# Patient Record
Sex: Male | Born: 1960
Health system: Southern US, Community
[De-identification: ages and names within clinical notes are randomized; demographics above are authoritative.]

## PROBLEM LIST (undated history)

## (undated) DIAGNOSIS — I10 Essential (primary) hypertension: Secondary | ICD-10-CM

## (undated) DIAGNOSIS — E785 Hyperlipidemia, unspecified: Secondary | ICD-10-CM

## (undated) DIAGNOSIS — R739 Hyperglycemia, unspecified: Secondary | ICD-10-CM

## (undated) DIAGNOSIS — E119 Type 2 diabetes mellitus without complications: Secondary | ICD-10-CM

## (undated) DIAGNOSIS — Z8719 Personal history of other diseases of the digestive system: Secondary | ICD-10-CM

## (undated) HISTORY — PX: TONSILLECTOMY: SUR1361

## (undated) HISTORY — PX: WISDOM TOOTH EXTRACTION: SHX21

## (undated) HISTORY — DX: Type 2 diabetes mellitus without complications: E11.9

## (undated) HISTORY — DX: Personal history of other diseases of the digestive system: Z87.19

## (undated) HISTORY — PX: ADENOIDECTOMY: SUR15

## (undated) HISTORY — DX: Hyperlipidemia, unspecified: E78.5

## (undated) HISTORY — PX: OTHER SURGICAL HISTORY: SHX169

## (undated) HISTORY — DX: Hyperglycemia, unspecified: R73.9

## (undated) HISTORY — DX: Essential (primary) hypertension: I10

---

## 1998-06-29 ENCOUNTER — Emergency Department (HOSPITAL_COMMUNITY): Admission: EM | Admit: 1998-06-29 | Discharge: 1998-06-29 | Payer: Self-pay | Admitting: Emergency Medicine

## 1999-10-16 ENCOUNTER — Encounter: Payer: Self-pay | Admitting: Internal Medicine

## 2003-12-31 HISTORY — PX: COLONOSCOPY: SHX174

## 2004-06-27 ENCOUNTER — Encounter: Payer: Self-pay | Admitting: Internal Medicine

## 2004-07-30 DIAGNOSIS — Z8719 Personal history of other diseases of the digestive system: Secondary | ICD-10-CM

## 2004-07-30 HISTORY — DX: Personal history of other diseases of the digestive system: Z87.19

## 2004-08-15 ENCOUNTER — Ambulatory Visit (HOSPITAL_COMMUNITY): Admission: RE | Admit: 2004-08-15 | Discharge: 2004-08-15 | Payer: Self-pay | Admitting: Gastroenterology

## 2004-08-15 ENCOUNTER — Encounter: Payer: Self-pay | Admitting: Internal Medicine

## 2004-08-15 ENCOUNTER — Encounter (INDEPENDENT_AMBULATORY_CARE_PROVIDER_SITE_OTHER): Payer: Self-pay | Admitting: *Deleted

## 2004-11-29 ENCOUNTER — Ambulatory Visit: Payer: Self-pay | Admitting: Internal Medicine

## 2004-12-05 ENCOUNTER — Ambulatory Visit: Payer: Self-pay | Admitting: Internal Medicine

## 2005-02-06 ENCOUNTER — Ambulatory Visit: Payer: Self-pay | Admitting: Internal Medicine

## 2005-02-13 ENCOUNTER — Ambulatory Visit: Payer: Self-pay | Admitting: Internal Medicine

## 2005-04-15 ENCOUNTER — Ambulatory Visit: Payer: Self-pay | Admitting: Internal Medicine

## 2005-04-22 ENCOUNTER — Ambulatory Visit: Payer: Self-pay | Admitting: Internal Medicine

## 2005-04-30 ENCOUNTER — Encounter: Admission: RE | Admit: 2005-04-30 | Discharge: 2005-07-29 | Payer: Self-pay | Admitting: Internal Medicine

## 2005-06-24 ENCOUNTER — Ambulatory Visit: Payer: Self-pay | Admitting: Internal Medicine

## 2005-08-13 ENCOUNTER — Ambulatory Visit: Payer: Self-pay | Admitting: Internal Medicine

## 2005-08-22 ENCOUNTER — Ambulatory Visit: Payer: Self-pay | Admitting: Internal Medicine

## 2006-01-28 ENCOUNTER — Ambulatory Visit: Payer: Self-pay | Admitting: Internal Medicine

## 2006-02-03 ENCOUNTER — Ambulatory Visit: Payer: Self-pay | Admitting: Internal Medicine

## 2006-04-01 ENCOUNTER — Ambulatory Visit: Payer: Self-pay | Admitting: Internal Medicine

## 2006-04-08 ENCOUNTER — Ambulatory Visit: Payer: Self-pay | Admitting: Internal Medicine

## 2006-06-23 ENCOUNTER — Ambulatory Visit: Payer: Self-pay | Admitting: Internal Medicine

## 2006-08-21 ENCOUNTER — Ambulatory Visit: Payer: Self-pay | Admitting: Internal Medicine

## 2006-08-28 ENCOUNTER — Ambulatory Visit: Payer: Self-pay | Admitting: Internal Medicine

## 2006-12-03 ENCOUNTER — Ambulatory Visit: Payer: Self-pay | Admitting: Internal Medicine

## 2006-12-03 LAB — CONVERTED CEMR LAB
ALT: 32 units/L (ref 0–40)
AST: 34 units/L (ref 0–37)
BUN: 14 mg/dL (ref 6–23)
Chloride: 107 meq/L (ref 96–112)
Creatinine, Ser: 1 mg/dL (ref 0.4–1.5)
GFR calc non Af Amer: 87 mL/min
LDL Cholesterol: 81 mg/dL (ref 0–99)
Potassium: 4.3 meq/L (ref 3.5–5.1)
Sodium: 141 meq/L (ref 135–145)
Triglyceride fasting, serum: 71 mg/dL (ref 0–149)
VLDL: 14 mg/dL (ref 0–40)

## 2006-12-09 ENCOUNTER — Ambulatory Visit: Payer: Self-pay | Admitting: Internal Medicine

## 2007-06-04 ENCOUNTER — Ambulatory Visit: Payer: Self-pay | Admitting: Internal Medicine

## 2007-06-10 ENCOUNTER — Encounter: Payer: Self-pay | Admitting: Internal Medicine

## 2007-06-10 ENCOUNTER — Ambulatory Visit: Payer: Self-pay | Admitting: Internal Medicine

## 2007-06-10 DIAGNOSIS — R7301 Impaired fasting glucose: Secondary | ICD-10-CM | POA: Insufficient documentation

## 2007-06-10 DIAGNOSIS — Z8719 Personal history of other diseases of the digestive system: Secondary | ICD-10-CM | POA: Insufficient documentation

## 2007-06-10 DIAGNOSIS — E785 Hyperlipidemia, unspecified: Secondary | ICD-10-CM | POA: Insufficient documentation

## 2007-06-10 DIAGNOSIS — I10 Essential (primary) hypertension: Secondary | ICD-10-CM | POA: Insufficient documentation

## 2007-10-13 ENCOUNTER — Ambulatory Visit: Payer: Self-pay | Admitting: Internal Medicine

## 2007-10-16 LAB — CONVERTED CEMR LAB
ALT: 17 units/L (ref 0–53)
AST: 19 units/L (ref 0–37)
Calcium: 9.5 mg/dL (ref 8.4–10.5)
Chloride: 108 meq/L (ref 96–112)
Creatinine, Ser: 1 mg/dL (ref 0.4–1.5)
GFR calc non Af Amer: 87 mL/min
HDL: 43.4 mg/dL (ref >39.0)
LDL Cholesterol: 114 mg/dL — ABNORMAL HIGH (ref 0–99)
Sodium: 144 meq/L (ref 135–145)
TSH: 1.84 microintl units/mL (ref 0.35–5.50)
VLDL: 14 mg/dL (ref 0–40)

## 2007-10-20 ENCOUNTER — Ambulatory Visit: Payer: Self-pay | Admitting: Internal Medicine

## 2007-10-20 LAB — CONVERTED CEMR LAB
Cholesterol, target level: 200 mg/dL
LDL Goal: 160 mg/dL

## 2008-02-01 ENCOUNTER — Ambulatory Visit: Payer: Self-pay | Admitting: Internal Medicine

## 2008-02-03 LAB — CONVERTED CEMR LAB
BUN: 11 mg/dL (ref 6–23)
Calcium: 9.4 mg/dL (ref 8.4–10.5)
Chloride: 107 meq/L (ref 96–112)
Creatinine, Ser: 1.1 mg/dL (ref 0.4–1.5)
GFR calc non Af Amer: 77 mL/min
Hgb A1c MFr Bld: 5.7 % (ref 4.6–6.0)
LDL Cholesterol: 115 mg/dL — ABNORMAL HIGH (ref 0–99)
Sodium: 141 meq/L (ref 135–145)
VLDL: 14 mg/dL (ref 0–40)

## 2008-02-08 ENCOUNTER — Ambulatory Visit: Payer: Self-pay | Admitting: Internal Medicine

## 2008-07-05 ENCOUNTER — Ambulatory Visit: Payer: Self-pay | Admitting: Internal Medicine

## 2008-07-05 LAB — CONVERTED CEMR LAB
Creatinine,U: 151.4 mg/dL
Hgb A1c MFr Bld: 6.1 % — ABNORMAL HIGH (ref 4.6–6.0)
Microalb Creat Ratio: 1.3 mg/g (ref 0.0–30.0)
Microalb, Ur: 0.2 mg/dL (ref 0.0–1.9)

## 2008-07-14 ENCOUNTER — Ambulatory Visit: Payer: Self-pay | Admitting: Internal Medicine

## 2009-01-02 ENCOUNTER — Ambulatory Visit: Payer: Self-pay | Admitting: Internal Medicine

## 2009-01-02 LAB — CONVERTED CEMR LAB
BUN: 11 mg/dL (ref 6–23)
Creatinine, Ser: 1 mg/dL (ref 0.4–1.5)
GFR calc Af Amer: 104 mL/min
Glucose, Bld: 178 mg/dL — ABNORMAL HIGH (ref 70–99)
Potassium: 4.1 meq/L (ref 3.5–5.1)

## 2009-01-09 ENCOUNTER — Ambulatory Visit: Payer: Self-pay | Admitting: Internal Medicine

## 2009-02-17 ENCOUNTER — Telehealth: Payer: Self-pay | Admitting: *Deleted

## 2009-05-02 ENCOUNTER — Telehealth: Payer: Self-pay | Admitting: *Deleted

## 2009-05-08 ENCOUNTER — Ambulatory Visit: Payer: Self-pay | Admitting: Internal Medicine

## 2009-05-08 LAB — CONVERTED CEMR LAB
ALT: 18 units/L (ref 0–53)
AST: 22 units/L (ref 0–37)
Alkaline Phosphatase: 51 units/L (ref 39–117)
BUN: 18 mg/dL (ref 6–23)
Bilirubin Urine: NEGATIVE
Bilirubin, Direct: 0.2 mg/dL (ref 0.0–0.3)
Cholesterol: 165 mg/dL (ref 0–200)
Creatinine, Ser: 0.9 mg/dL (ref 0.4–1.5)
Eosinophils Relative: 2 % (ref 0.0–5.0)
GFR calc non Af Amer: 96.75 mL/min (ref >60)
Hgb A1c MFr Bld: 6.2 % (ref 4.6–6.5)
Ketones, urine, test strip: NEGATIVE
LDL Cholesterol: 108 mg/dL — ABNORMAL HIGH (ref 0–99)
Lymphocytes Relative: 28.5 % (ref 12.0–46.0)
Microalb Creat Ratio: 2.6 mg/g (ref 0.0–30.0)
Monocytes Relative: 6.1 % (ref 3.0–12.0)
Neutrophils Relative %: 63.1 % (ref 43.0–77.0)
Nitrite: NEGATIVE
Platelets: 157 10*3/uL (ref 150.0–400.0)
Protein, U semiquant: NEGATIVE
Total Bilirubin: 1.1 mg/dL (ref 0.3–1.2)
Total CHOL/HDL Ratio: 4
Triglycerides: 66 mg/dL (ref 0.0–149.0)
Urobilinogen, UA: 0.2
VLDL: 13.2 mg/dL (ref 0.0–40.0)
WBC: 4.2 10*3/uL — ABNORMAL LOW (ref 4.5–10.5)

## 2009-05-15 ENCOUNTER — Ambulatory Visit: Payer: Self-pay | Admitting: Internal Medicine

## 2009-05-15 DIAGNOSIS — T50995A Adverse effect of other drugs, medicaments and biological substances, initial encounter: Secondary | ICD-10-CM | POA: Insufficient documentation

## 2009-05-15 DIAGNOSIS — B351 Tinea unguium: Secondary | ICD-10-CM | POA: Insufficient documentation

## 2009-06-19 ENCOUNTER — Ambulatory Visit: Payer: Self-pay | Admitting: Internal Medicine

## 2009-06-19 DIAGNOSIS — M25469 Effusion, unspecified knee: Secondary | ICD-10-CM | POA: Insufficient documentation

## 2009-06-24 ENCOUNTER — Encounter: Payer: Self-pay | Admitting: Internal Medicine

## 2009-10-10 ENCOUNTER — Ambulatory Visit: Payer: Self-pay | Admitting: Internal Medicine

## 2009-10-10 LAB — CONVERTED CEMR LAB: Hgb A1c MFr Bld: 5.8 % (ref 4.6–6.5)

## 2009-10-17 ENCOUNTER — Ambulatory Visit: Payer: Self-pay | Admitting: Internal Medicine

## 2010-05-11 ENCOUNTER — Ambulatory Visit: Payer: Self-pay | Admitting: Internal Medicine

## 2010-05-11 LAB — CONVERTED CEMR LAB
ALT: 21 units/L (ref 0–53)
Albumin: 4.5 g/dL (ref 3.5–5.2)
Alkaline Phosphatase: 46 units/L (ref 39–117)
Basophils Relative: 0.4 % (ref 0.0–3.0)
Bilirubin Urine: NEGATIVE
CO2: 30 meq/L (ref 19–32)
Chloride: 106 meq/L (ref 96–112)
Eosinophils Absolute: 0.1 10*3/uL (ref 0.0–0.7)
Hemoglobin: 14.5 g/dL (ref 13.0–17.0)
Hgb A1c MFr Bld: 5.8 % (ref 4.6–6.5)
Ketones, urine, test strip: NEGATIVE
MCHC: 34.5 g/dL (ref 30.0–36.0)
MCV: 90.5 fL (ref 78.0–100.0)
Monocytes Absolute: 0.3 10*3/uL (ref 0.1–1.0)
Neutro Abs: 3.1 10*3/uL (ref 1.4–7.7)
Nitrite: NEGATIVE
PSA: 1.08 ng/mL (ref 0.10–4.00)
Potassium: 4.6 meq/L (ref 3.5–5.1)
RBC: 4.66 M/uL (ref 4.22–5.81)
Sodium: 145 meq/L (ref 135–145)
Specific Gravity, Urine: 1.025
Total CHOL/HDL Ratio: 3
Total Protein: 7.3 g/dL (ref 6.0–8.3)

## 2010-05-18 ENCOUNTER — Ambulatory Visit: Payer: Self-pay | Admitting: Internal Medicine

## 2010-05-30 ENCOUNTER — Telehealth: Payer: Self-pay | Admitting: *Deleted

## 2011-01-29 NOTE — Procedures (Signed)
Summary: Colonoscopy/[place]  Colonoscopy/[place]   Imported By: Sherian Rein 06/15/2010 14:22:52  _____________________________________________________________________  External Attachment:    Type:   Image     Comment:   External Document

## 2011-01-29 NOTE — Progress Notes (Signed)
Summary: refill  Phone Note From Pharmacy   Caller: [facility] Reason for Call: Needs renewal Details for Reason: simvastatin Initial call taken by: Romualdo Bolk, CMA Duncan Dull),  May 30, 2010 1:33 PM  Follow-up for Phone Call        Rx sent to pharmacy Follow-up by: Romualdo Bolk, CMA (AAMA),  May 30, 2010 1:34 PM    Prescriptions: ZOCOR 80 MG  TABS (SIMVASTATIN) 1 by mouth once daily  #90 x 3   Entered by:   Romualdo Bolk, CMA (AAMA)   Authorized by:   Madelin Headings MD   Signed by:   Romualdo Bolk, CMA (AAMA) on 05/30/2010   Method used:   Electronically to        [facility]* (mail-order)             ,          Ph: [phone]       Fax: [phone]   RxID:   [id number]

## 2011-01-29 NOTE — Assessment & Plan Note (Signed)
Summary: CPX//SL[facility]   Vital Signs:  Patient profile:   50 year old male Height:      70.75 inches Weight:      203 pounds BMI:     28.62 Pulse rate:   60 / minute BP sitting:   130 / 80  (left arm) Cuff size:   regular CC: CPX   History of Present Illness: David Copeland comesin for preventive visit  and no chang ein health since last visit. Is doing well  going to train for a marathin in the fall.     No injuries  new meds or Ed visits. BP on no meds and doing well LIPIDs no se of meds. BG taking betformin   no se and doing well.   No numbness change in vision, CV or PUlm problems .  Preventive Care Screening  Prior Values:    PSA:  1.08 (05/11/2010)    Last Tetanus Booster:  Tdap (05/15/2009)   Preventive Screening-Counseling & Management  Alcohol-Tobacco     Alcohol drinks/day: <1     Alcohol type: beer     Smoking Status: never  Caffeine-Diet-Exercise     Caffeine use/day: 5     Does Patient Exercise: yes     Depression Counseling: not indicated; screening negative for depression  Hep-HIV-STD-Contraception     Dental Visit-last 6 months yes     Sun Exposure-Excessive: no  Safety-Violence-Falls     Seat Belt Use: yes     Firearms in the Home: no firearms in the home     Smoke Detectors: yes      Blood Transfusions:  no.    Current Medications (verified): 1)  Tgt Aspirin 81 Mg Tbec (Aspirin) .... Take 1 Tablet By Mouth Once A Day 2)  Zocor 80 Mg  Tabs (Simvastatin) .Marland Kitchen.. 1 By Mouth Once Daily 3)  Metformin Hcl 500 Mg Tabs (Metformin Hcl) .Marland Kitchen.. 1 By Mouth 1-2 X Per Day  Allergies (verified): 1)  Lipitor  Past History:  Past medical, surgical, family and social histories (including risk factors) reviewed, and no changes noted (except as noted below).  Past Medical History: Diverticulitis, hx of colonoscopy  8.05 Hyperlipidemia Hypertension  off med after lifestyle intervention  controlled  hyperglycemia   Consults: None    Past Surgical  History: Reviewed history from 01/09/2009 and no changes required. Removed Breast Glands- Damaged Secondary to Soccer Wisdom Tooth Extraction Adenoidectomy Tonsillectomy   Past History:  Care Management: Gastroenterology: Dr Loreta Ave   Family History: Reviewed history from 06/19/2009 and no changes required. Family History of Stroke M 1st degree relative 15  Father in hot weather ? neg hx of arhtirtis no known premature heart disease.  Social History: Reviewed history from 01/09/2009 and no changes required. Married with children Former Smoker Regular exercise-yes  is [place] as homeland      No ets  . runs    Sleep adequate.Seat Belt Use:  yes Dental Care w/in 6 mos.:  yes Sun Exposure-Excessive:  no Blood Transfusions:  no  Review of Systems  The patient denies anorexia, fever, weight loss, weight gain, vision loss, decreased hearing, hoarseness, chest pain, syncope, dyspnea on exertion, peripheral edema, prolonged cough, headaches, hemoptysis, abdominal pain, melena, hematochezia, severe indigestion/heartburn, hematuria, incontinence, genital sores, muscle weakness, suspicious skin lesions, transient blindness, difficulty walking, depression, unusual weight change, abnormal bleeding, enlarged lymph nodes, angioedema, and testicular masses.   Physical Exam General Appearance: well developed, well nourished, no acute distress Eyes: conjunctiva and lids normal, PERRLA,  EOMI, WNL Ears, Nose, Mouth, Throat: TM clear, nares clear, oral exam WNL Neck: supple, no lymphadenopathy, no thyromegaly, no JVD Respiratory: clear to auscultation and percussion, respiratory effort normal Cardiovascular: regular rate and rhythm, S1-S2, no murmur, rub or gallop, no bruits, peripheral pulses normal and symmetric, no cyanosis, clubbing, edema or varicosities Chest: no scars, masses, tenderness; no asymmetry, skin changes, nipple discharge, no gynecomastia   Gastrointestinal: soft, non-tender; no  hepatosplenomegaly, masses; active bowel sounds all quadrants, gu; no masses, tenderness, hemorrhoids  tag   Genitourinary: or prostate 0-1+ no nodules or tenderness Lymphatic: no cervical, axillary or inguinal adenopathy Musculoskeletal: gait normal, muscle tone and strength WNL, no joint swelling, effusions, discoloration, crepitus  Skin: clear, good turgor, color WNL, no rashes, lesions, or ulcerations  great toes nails thickened   some  Neurologic: normal mental status, normal reflexes, normal strength, sensation, and motion  monofilament nl  Psychiatric: alert; oriented to person, place and time Other Exam:  EKG NSR   rate  nl intervals     Impression & Recommendations:  Problem # 1:  HEALTH MAINTENANCE EXAM, ADULT (ICD-V70.0)  Discussed nutrition,exercise,diet,healthy weight, vitamin D and calcium.   Sun protection.  Orders: EKG w/ Interpretation (93000)  Problem # 2:  IMPAIRED FASTING GLUCOSE (ICD-790.21) Assessment: Improved doing great with this  and lifestyle intervention no se of meds    so can check yearly   The following medications were removed from the medication list:    Metformin Hcl 500 Mg Tb24 (Metformin hcl) .Marland Kitchen... 2 by mouth once daily His updated medication list for this problem includes:    Metformin Hcl 500 Mg Tabs (Metformin hcl) .Marland Kitchen... 1 by mouth 1-2 x per day  Problem # 3:  HYPERLIPIDEMIA (ICD-272.4)  no se of meds  for now   His updated medication list for this problem includes:    Zocor 80 Mg Tabs (Simvastatin) .Marland Kitchen... 1 by mouth once daily  Labs Reviewed: SGOT: 28 (05/11/2010)   SGPT: 21 (05/11/2010)  Lipid Goals: Chol Goal: 200 (10/20/2007)   HDL Goal: 40 (10/20/2007)   LDL Goal: 160 (10/20/2007)   TG Goal: 150 (10/20/2007)  Prior 10 Yr Risk Heart Disease: 4 % (02/08/2008)   HDL:54.10 (05/11/2010), 43.50 (05/08/2009)  LDL:84 (05/11/2010), 108 (16/09/9603)  Chol:151 (05/11/2010), 165 (05/08/2009)  Trig:66.0 (05/11/2010), 66.0  (05/08/2009)  Orders: EKG w/ Interpretation (93000)  Problem # 4:  ONYCHOMYCOSIS, TOENAILS (ICD-110.1) local care no lsx   Complete Medication List: 1)  Tgt Aspirin 81 Mg Tbec (Aspirin) .... Take 1 tablet by mouth once a day 2)  Zocor 80 Mg Tabs (Simvastatin) .Marland Kitchen.. 1 by mouth once daily 3)  Metformin Hcl 500 Mg Tabs (Metformin hcl) .Marland Kitchen.. 1 by mouth 1-2 x per day  Patient Instructions: 1)  continue  healthy  lifestyle intervention . 2)  CPX in 1 year  with HG a1c  Prescriptions: METFORMIN HCL 500 MG TABS (METFORMIN HCL) 1 by mouth 1-2 x per day  #180 x 3   Entered and Authorized by:   Madelin Headings MD   Signed by:   Madelin Headings MD on 05/18/2010   Method used:   Electronically to        [facility]* (mail-order)             ,          Ph: [phone]       Fax: [phone]   RxID:   [id number]

## 2011-01-29 NOTE — Progress Notes (Signed)
Summary: List of Blood Pressures taken at home  List of Blood Pressures taken at home   Imported By: Maryln Gottron 06/21/2010 10:13:18  _____________________________________________________________________  External Attachment:    Type:   Image     Comment:   External Document

## 2011-01-29 NOTE — Consult Note (Signed)
Summary: [facility]  [facility]   Imported By: Sherian Rein 06/15/2010 14:33:12  _____________________________________________________________________  External Attachment:    Type:   Image     Comment:   External Document

## 2011-05-17 NOTE — Op Note (Signed)
NAME:  David Copeland, David Copeland                    ACCOUNT NO.:  [id number]   MEDICAL RECORD NO.:  [id number]                   PATIENT TYPE:  AMB   LOCATION:  ENDO                                 FACILITY:  [facility]   PHYSICIAN:  Anselmo Rod, M.D.               DATE OF BIRTH:  05-30-1961   DATE OF PROCEDURE:  08/15/2004  DATE OF DISCHARGE:                                 OPERATIVE REPORT   PROCEDURE PERFORMED:  Colonoscopy with cold biopsies x3.   ENDOSCOPIST:  Anselmo Rod, M.D.   INSTRUMENT USED:  Olympus video colonoscope.   INDICATIONS FOR PROCEDURE:  A 50 year old white male with history of change  in bowel habits.  Rule out colonic polyps, masses, etc.   PREPROCEDURE PREPARATION:  Informed consent was procured from the patient.  The patient fasted for eight hours prior to the procedure and prepped with a  bottle of magnesium citrate and a gallop of GoLYTELY the night prior to the  procedure.   PREPROCEDURE PHYSICAL:  VITAL SIGNS:  Stable vital signs.  NECK:  Supple.  CHEST:  Clear to auscultation.  CARDIOVASCULAR:  S1 and S2 regular.  ABDOMEN:  Soft with normal bowel sounds.   DESCRIPTION OF PROCEDURE:  The patient was placed in left lateral decubitus  position, sedated with 80 mg of Demerol and 8 mg of Versed in slow  incremental doses.  Once the patient was adequately sedated and maintained  on low flow oxygen and continuous cardiac monitoring, the Olympus video  colonoscope was advanced from the rectum to the cecum.  There was some  residual stool in the colon.  Multiple washings were done.  The appendiceal  orifice and ileocecal valve were clearly visualized and photographed.  The  terminal ileum appeared healthy and without lesions.  __________  colonic  mucosa appeared healthy except for a small sessile polyp biopsied from the  rectum x3.  Small internal hemorrhoids were seen on retroflexion in the  rectum.  The patient tolerated the procedure well without any  immediate  complications.   IMPRESSION:  1. Small nonbleeding internal hemorrhoids.  2. Small sessile polyp biopsied from the rectum x3.  3. Sigmoid diverticulosis.  4. Normal-appearing transverse colon, right colon, cecum and terminal ileum.   RECOMMENDATIONS:  1. Await pathology results.  2. Avoid nonsteroidal including aspirin for now.  3. Brochures on diverticulosis given to the patient for education.  4. Outpatient follow-up in the next two weeks for further recommendations.                                               Anselmo Rod, M.D.    JNM/MEDQ  D:  08/15/2004  T:  08/15/2004  Job:  [id number]   cc:   Neta Mends. Fabian Sharp, M.D. [facility]

## 2011-08-19 ENCOUNTER — Other Ambulatory Visit (INDEPENDENT_AMBULATORY_CARE_PROVIDER_SITE_OTHER): Payer: BC Managed Care – PPO

## 2011-08-19 DIAGNOSIS — Z0000 Encounter for general adult medical examination without abnormal findings: Secondary | ICD-10-CM

## 2011-08-19 LAB — BASIC METABOLIC PANEL
BUN: 16 mg/dL (ref 6–23)
CO2: 28 mEq/L (ref 19–32)
Chloride: 104 mEq/L (ref 96–112)
Potassium: 4.5 mEq/L (ref 3.5–5.1)

## 2011-08-19 LAB — POCT URINALYSIS DIPSTICK
Glucose, UA: NEGATIVE
Spec Grav, UA: 1.025

## 2011-08-19 LAB — HEMOGLOBIN A1C: Hgb A1c MFr Bld: 5.8 % (ref 4.6–6.5)

## 2011-08-19 LAB — MICROALBUMIN / CREATININE URINE RATIO
Creatinine,U: 251.3 mg/dL
Microalb, Ur: 0.5 mg/dL (ref 0.0–1.9)

## 2011-08-19 LAB — CBC WITH DIFFERENTIAL/PLATELET
Basophils Relative: 0.5 % (ref 0.0–3.0)
Eosinophils Relative: 1.9 % (ref 0.0–5.0)
HCT: 42.8 % (ref 39.0–52.0)
Lymphs Abs: 1.6 10*3/uL (ref 0.7–4.0)
MCHC: 33.4 g/dL (ref 30.0–36.0)
MCV: 90.7 fl (ref 78.0–100.0)
Monocytes Absolute: 0.3 10*3/uL (ref 0.1–1.0)
Platelets: 159 10*3/uL (ref 150.0–400.0)
WBC: 4.5 10*3/uL (ref 4.5–10.5)

## 2011-08-19 LAB — LIPID PANEL: Cholesterol: 175 mg/dL (ref 0–200)

## 2011-08-19 LAB — HEPATIC FUNCTION PANEL
ALT: 20 U/L (ref 0–53)
Total Bilirubin: 0.7 mg/dL (ref 0.3–1.2)
Total Protein: 7 g/dL (ref 6.0–8.3)

## 2011-08-19 LAB — TSH: TSH: 1.67 u[IU]/mL (ref 0.35–5.50)

## 2011-08-21 ENCOUNTER — Other Ambulatory Visit: Payer: Self-pay

## 2011-08-26 ENCOUNTER — Encounter: Payer: Self-pay | Admitting: Internal Medicine

## 2011-08-26 ENCOUNTER — Ambulatory Visit (INDEPENDENT_AMBULATORY_CARE_PROVIDER_SITE_OTHER): Payer: BC Managed Care – PPO | Admitting: Internal Medicine

## 2011-08-26 VITALS — BP 152/90 | HR 66 | Ht 70.75 in | Wt 202.0 lb

## 2011-08-26 DIAGNOSIS — Z0000 Encounter for general adult medical examination without abnormal findings: Secondary | ICD-10-CM

## 2011-08-26 DIAGNOSIS — R7301 Impaired fasting glucose: Secondary | ICD-10-CM

## 2011-08-26 DIAGNOSIS — E785 Hyperlipidemia, unspecified: Secondary | ICD-10-CM

## 2011-08-26 MED ORDER — METFORMIN HCL 500 MG PO TABS: 500.0000 mg | ORAL_TABLET | Freq: Every day | ORAL | Status: DC

## 2011-08-26 MED ORDER — SIMVASTATIN 80 MG PO TABS: 80.0000 mg | ORAL_TABLET | Freq: Every day | ORAL | Status: DC

## 2011-08-26 NOTE — Patient Instructions (Signed)
Continue lifestyle intervention healthy eating and exercise .  Check Blood pressure readings at home and make sure  At goal.  Below 140/90 If ok then yearly preventive visit with labs

## 2011-08-26 NOTE — Progress Notes (Signed)
  Subjective:    Patient ID: David Copeland, male    DOB: [birth year], 50 y.o.   MRN: [id number]  HPI Patient comes in for a preventive visit and medication check. Since his last visit he has done quite well. 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  Doing well.  No falls .  Has smoke detector and wears seat belts.  No firearms. No excess sun exposure. Sees dentist regularly . No depression Hypertension: Controlled with diet and exercise his blood pressures have been in the 120/80 range at home.  Lipids: No side effects of medicine continues on simvastatin Hyperglycemia; on low-dose metformin doing well. No vision changes polyuria polydipsia.  Review of Systems ROS:  GEN/ HEENTNo fever, significant weight changes sweats headaches vision problems hearing changes, CV/ PULM; No chest pain shortness of breath cough, syncope,edema  change in exercise tolerance. GI /GU: No adominal pain, vomiting, change in bowel habits. No blood in the stool. No significant GU symptoms. SKIN/HEME: ,no acute skin rashes suspicious lesions or bleeding. No lymphadenopathy, nodules, masses. Check a red spot on his scalp that his wife wants to be checked he does use sunscreen NEURO/ PSYCH:  No neurologic signs such as weakness numbness No depression anxiety. IMM/ Allergy: No unusual infections.  Allergy .   REST of 12 system review negative  Past history family history social history reviewed in the electronic medical record.      Objective:   Physical Exam Physical Exam: Vital signs reviewed [id number] is a well-developed well-nourished alert cooperative  White male  who appears   stated age in no acute distress.  HEENT: normocephalic  atraumatic , Eyes: PERRL EOM's full, conjunctiva clear, Nares: patent no deformity discharge or tenderness., Ears: no deformity EAC's clear TMs with normal landmarks. Mouth: clear OP, no lesions, edema.  Moist mucous membranes. Dentition in adequate  repair. NECK: supple without masses, thyromegaly or bruits. CHEST/PULM:  Clear to auscultation and percussion breath sounds equal no wheeze , rales or rhonchi. No chest wall deformities or tenderness. CV: PMI is nondisplaced, S1 S2 no gallops, murmurs, rubs. Peripheral pulses are full without delay.No JVD .  Repeat BP reading large 142/80 ABDOMEN: Bowel sounds normal nontender  No guard or rebound, no hepato splenomegal no CVA tenderness.  No hernia. Extremtities:  No clubbing cyanosis or edema, no acute joint swelling or redness no focal atrophy NEURO:  Oriented x3, cranial nerves 3-12 appear to be intact, no obvious focal weakness,gait within normal limits no abnormal reflexes or asymmetrical SKIN: No acute rashes normal turgor, color, no bruising or petechiae. Sun changes on scalp and one 2 mm angiomatous lesion  Symmetrical and benign appearing  Onychomycosis on great toenails PSYCH: Oriented, good eye contact, no obvious depression anxiety, cognition and judgment appear normal. LN:  No cervical axillary or inguinal adenopathy Labs reviewed with patient. Lab Results  Component Value Date   HGBA1C 5.8 08/19/2011       Assessment & Plan:  Preventive Health Care Counseled regarding healthy nutrition, exercise, sleep, injury prevention, calcium vit d and healthy weight .Continue.  BP  Check readings at home LIPIDS no change in meds  Elevated HDL better ratio 3 this year  Hyperglycemia : controlled over time  Continue meds for now and check in a year Skin sunscreen protecition

## 2012-08-19 ENCOUNTER — Other Ambulatory Visit: Payer: BC Managed Care – PPO

## 2012-08-20 ENCOUNTER — Other Ambulatory Visit (INDEPENDENT_AMBULATORY_CARE_PROVIDER_SITE_OTHER): Payer: BC Managed Care – PPO

## 2012-08-20 DIAGNOSIS — Z0000 Encounter for general adult medical examination without abnormal findings: Secondary | ICD-10-CM

## 2012-08-20 LAB — BASIC METABOLIC PANEL
BUN: 18 mg/dL (ref 6–23)
CO2: 28 mEq/L (ref 19–32)
Chloride: 105 mEq/L (ref 96–112)
Creatinine, Ser: 0.9 mg/dL (ref 0.4–1.5)
Glucose, Bld: 123 mg/dL — ABNORMAL HIGH (ref 70–99)

## 2012-08-20 LAB — CBC WITH DIFFERENTIAL/PLATELET
Eosinophils Absolute: 0.1 10*3/uL (ref 0.0–0.7)
MCHC: 33 g/dL (ref 30.0–36.0)
MCV: 90.8 fl (ref 78.0–100.0)
Monocytes Absolute: 0.3 10*3/uL (ref 0.1–1.0)
Neutrophils Relative %: 58.5 % (ref 43.0–77.0)
Platelets: 168 10*3/uL (ref 150.0–400.0)

## 2012-08-20 LAB — HEPATIC FUNCTION PANEL
Bilirubin, Direct: 0.1 mg/dL (ref 0.0–0.3)
Total Bilirubin: 1 mg/dL (ref 0.3–1.2)
Total Protein: 7.3 g/dL (ref 6.0–8.3)

## 2012-08-20 LAB — LIPID PANEL
Cholesterol: 159 mg/dL (ref 0–200)
VLDL: 17.6 mg/dL (ref 0.0–40.0)

## 2012-08-20 LAB — POCT URINALYSIS DIPSTICK
Ketones, UA: NEGATIVE
Leukocytes, UA: NEGATIVE
Nitrite, UA: NEGATIVE
Protein, UA: NEGATIVE

## 2012-08-20 LAB — TSH: TSH: 1.28 u[IU]/mL (ref 0.35–5.50)

## 2012-08-26 ENCOUNTER — Encounter: Payer: Self-pay | Admitting: Internal Medicine

## 2012-08-26 ENCOUNTER — Ambulatory Visit (INDEPENDENT_AMBULATORY_CARE_PROVIDER_SITE_OTHER): Payer: BC Managed Care – PPO | Admitting: Internal Medicine

## 2012-08-26 VITALS — BP 144/80 | HR 71 | Temp 97.7°F | Ht 70.75 in | Wt 205.0 lb

## 2012-08-26 DIAGNOSIS — E785 Hyperlipidemia, unspecified: Secondary | ICD-10-CM

## 2012-08-26 DIAGNOSIS — I10 Essential (primary) hypertension: Secondary | ICD-10-CM

## 2012-08-26 DIAGNOSIS — Z0000 Encounter for general adult medical examination without abnormal findings: Secondary | ICD-10-CM

## 2012-08-26 DIAGNOSIS — R7301 Impaired fasting glucose: Secondary | ICD-10-CM

## 2012-08-26 MED ORDER — METFORMIN HCL 500 MG PO TABS: 1000.0000 mg | ORAL_TABLET | Freq: Every day | ORAL | Status: DC

## 2012-08-26 NOTE — Patient Instructions (Signed)
Continue lifestyle intervention healthy eating and exercise . Skin protection when out side . CPX in a year or as needed.

## 2012-08-26 NOTE — Progress Notes (Signed)
Subjective:    Patient ID: David Copeland, male    DOB: [birth year], 51 y.o.   MRN: [id number]  HPI Patient comes in today for preventive visit and follow-up of medical issues. Update  history since  last visit: no major changes continues to exercise run and training for marathon again .  No cp sob . No new injury . Travels in job.  Review of Systems ROS:  GEN/ HEENT: No fever, significant weight changes sweats headaches vision problems hearing changes, CV/ PULM; No chest pain shortness of breath cough, syncope,edema  change in exercise tolerance. GI /GU: No adominal pain, vomiting, change in bowel habits. No blood in the stool. No significant GU symptoms. SKIN/HEME: ,no acute skin rashes suspicious lesions or bleeding. No lymphadenopathy, nodules, masses.  NEURO/ PSYCH:  No neurologic signs such as weakness numbness. No depression anxiety. IMM/ Allergy: No unusual infections.  Allergy .   REST of 12 system review negative except as per HPI Outpatient Encounter Prescriptions as of 08/26/2012  Medication Sig Dispense Refill  . aspirin 81 MG tablet Take 81 mg by mouth daily.        . metFORMIN (GLUCOPHAGE) 500 MG tablet Take 1 tablet (500 mg total) by mouth daily with breakfast. Take 1 tablet by mouth 1-2 times per day  90 tablet  3  . simvastatin (ZOCOR) 80 MG tablet Take 1 tablet (80 mg total) by mouth at bedtime.  90 tablet  3   Past history family history social history reviewed in the electronic medical record.     Objective:   Physical Exam BP 144/80  Pulse 71  Temp 97.7 F (36.5 C) (Oral)  Ht 5' 10.75" (1.797 m)  Wt 205 lb (92.987 kg)  BMI 28.79 kg/m2  SpO2 99% Wt Readings from Last 3 Encounters:  08/26/12 205 lb (92.987 kg)  08/26/11 202 lb (91.627 kg)  05/18/10 203 lb (92.08 kg)    Physical Exam: Vital signs reviewed [id number]:[id number] is a well-developed well-nourished alert cooperative  White male  who appears   stated age in no acute distress.  HEENT: normocephalic   traumatic , Eyes: PERRL EOM's full, conjunctiva clear, Nares: patent no deformity discharge or tenderness., Ears: no deformity EAC's clear TMs with normal landmarks. Mouth: clear OP, no lesions, edema.  Moist mucous membranes. Dentition in adequate repair. NECK: supple without masses, thyromegaly or bruits. CHEST/PULM:  Clear to auscultation and percussion breath sounds equal no wheeze , rales or rhonchi. No chest wall deformities or tenderness. CV: PMI is nondisplaced, S1 S2 no gallops, murmurs, rubs. Peripheral pulses are full without delay.No JVD .  ABDOMEN: Bowel sounds normal nontender  No guard or rebound, no hepato splenomegal no CVA tenderness.  No hernia. Extremtities:  No clubbing cyanosis or edema, no acute joint swelling or redness no focal atrophy NEURO:  Oriented x3, cranial nerves 3-12 appear to be intact, no obvious focal weakness,gait within normal limits no abnormal reflexes or asymmetrical SKIN: No acute rashes normal turgor, color, no bruising or petechiae. Sun changes  Light skin  Thickened toenails sensation in tact PSYCH: Oriented, good eye contact, no obvious depression anxiety, cognition and judgment appear normal. LN:  No cervical axillary or inguinal adenopathy Rectal Prostate 2+ no nodules      Lab Results  Component Value Date   WBC 4.9 08/20/2012   HGB 14.1 08/20/2012   HCT 42.9 08/20/2012   PLT 168.0 08/20/2012   GLUCOSE 123* 08/20/2012   CHOL 159 08/20/2012   TRIG 88.0  08/20/2012   HDL 57.10 08/20/2012   LDLCALC 84 08/20/2012   ALT 29 08/20/2012   AST 23 08/20/2012   NA 139 08/20/2012   K 4.8 08/20/2012   CL 105 08/20/2012   CREATININE 0.9 08/20/2012   BUN 18 08/20/2012   CO2 28 08/20/2012   TSH 1.28 08/20/2012   PSA 1.08 05/11/2010   HGBA1C 5.8 08/20/2012   MICROALBUR 0.5 08/19/2011       Assessment & Plan:  Preventive Health Care Counseled regarding healthy nutrition, exercise, sleep, injury prevention, calcium vit d and healthy weight . Is utd  hcm BP is good  at home   120/80 range  HT  Ok at home  borderlinet odau  134/84 right today .  On repeat  LIPIDS   Decrease to 40 mg cause of risk of high dose statin  Potential se .    And see if helps Bg continue exercise  Consider even lower if needed.  Hyperglycemia  Good a1c  fbs prediabetes .    uncertain how much statin contribures but doing well now.   Check a1c and lipid in 3-5 months if ok no ov needed but sched ov for now depending on how he is doing otherwise cpx in a year

## 2012-09-02 ENCOUNTER — Encounter: Payer: Self-pay | Admitting: Internal Medicine

## 2012-11-30 ENCOUNTER — Other Ambulatory Visit (INDEPENDENT_AMBULATORY_CARE_PROVIDER_SITE_OTHER): Payer: BC Managed Care – PPO

## 2012-11-30 DIAGNOSIS — E785 Hyperlipidemia, unspecified: Secondary | ICD-10-CM

## 2012-11-30 DIAGNOSIS — I10 Essential (primary) hypertension: Secondary | ICD-10-CM

## 2012-11-30 DIAGNOSIS — R7301 Impaired fasting glucose: Secondary | ICD-10-CM

## 2012-11-30 LAB — BASIC METABOLIC PANEL
BUN: 12 mg/dL (ref 6–23)
CO2: 28 mEq/L (ref 19–32)
Calcium: 9.5 mg/dL (ref 8.4–10.5)
GFR: 99.09 mL/min (ref >60.00)
Glucose, Bld: 135 mg/dL — ABNORMAL HIGH (ref 70–99)
Sodium: 141 mEq/L (ref 135–145)

## 2012-11-30 LAB — LIPID PANEL
Cholesterol: 192 mg/dL (ref 0–200)
HDL: 56.2 mg/dL (ref >39.00)
VLDL: 23.6 mg/dL (ref 0.0–40.0)

## 2012-12-07 ENCOUNTER — Encounter: Payer: Self-pay | Admitting: Internal Medicine

## 2012-12-07 ENCOUNTER — Ambulatory Visit (INDEPENDENT_AMBULATORY_CARE_PROVIDER_SITE_OTHER): Payer: BC Managed Care – PPO | Admitting: Internal Medicine

## 2012-12-07 VITALS — BP 144/90 | HR 83 | Temp 98.3°F | Wt 206.0 lb

## 2012-12-07 DIAGNOSIS — E785 Hyperlipidemia, unspecified: Secondary | ICD-10-CM

## 2012-12-07 DIAGNOSIS — R7301 Impaired fasting glucose: Secondary | ICD-10-CM

## 2012-12-07 NOTE — Progress Notes (Signed)
Chief Complaint  Patient presents with  . Follow-up    HPI: Pt here for fu because decreasing  Statin to 40 mg simva  No se of med . No other changes although  Some holiday eating  p may be up today cause of stress this am.    bp at home generally 120/80 range . No other change in health.  ROS: See pertinent positives and negatives per HPI.  Past Medical History  Diagnosis Date  . History of diverticulitis of colon 8/05  . Hyperlipidemia   . Hypertension     off med after lifestyle intervention contolled  . Hyperglycemia     Family History  Problem Relation Age of Onset  . Stroke Father     in hot weather  . Arthritis Neg Hx   . Stroke Other   . Breast cancer Mother     History   Social History  . Marital Status: Married    Spouse Name: N/A    Number of Children: N/A  . Years of Education: N/A   Social History Main Topics  . Smoking status: Never Smoker   . Smokeless tobacco: None  . Alcohol Use: Yes  . Drug Use: No  . Sexually Active: None   Other Topics Concern  . 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 RunsSleep adequate40- 50 jours per week.To do marathon in oct 13     Outpatient Encounter Prescriptions as of 12/07/2012  Medication Sig Dispense Refill  . aspirin 81 MG tablet Take 81 mg by mouth daily.        . metFORMIN (GLUCOPHAGE) 500 MG tablet Take 1,000 mg by mouth daily after breakfast.      . simvastatin (ZOCOR) 80 MG tablet Take 0.5 tablets (40 mg total) by mouth at bedtime.  90 tablet  3    EXAM:  BP 144/90  Pulse 83  Temp 98.3 F (36.8 C) (Oral)  Wt 206 lb (93.441 kg)  SpO2 96%  There is no height on file to calculate BMI. Wt Readings from Last 3 Encounters:  12/07/12 206 lb (93.441 kg)  08/26/12 205 lb (92.987 kg)  08/26/11 202 lb (91.627 kg)   GENERAL: vitals reviewed and listed above, alert, oriented, appears well  hydrated and in no acute distress  HEENT: atraumatic, conjunctiva  clear, no obvious abnormalities on inspection of external nose and ears NECK: no obvious masses on inspection palpation  MS: moves all extremities without noticeable focal  abnormality  PSYCH: pleasant and cooperative, no obvious depression or anxiety Lab Results  Component Value Date   WBC 4.9 08/20/2012   HGB 14.1 08/20/2012   HCT 42.9 08/20/2012   PLT 168.0 08/20/2012   GLUCOSE 135* 11/30/2012   CHOL 192 11/30/2012   TRIG 118.0 11/30/2012   HDL 56.20 11/30/2012   LDLCALC 112* 11/30/2012   ALT 29 08/20/2012   AST 23 08/20/2012   NA 141 11/30/2012   K 4.8 11/30/2012   CL 105 11/30/2012   CREATININE 0.9 11/30/2012   BUN 12 11/30/2012   CO2 28 11/30/2012   TSH 1.28 08/20/2012   PSA 1.08 05/11/2010   HGBA1C 5.9 11/30/2012   MICROALBUR 0.5 08/19/2011    ASSESSMENT AND PLAN:  Discussed the following assessment and plan:  1. HYPERLIPIDEMIA    almost at old goal   ok by newer guidlines  continue ( had se of lipitor )  2. Impaired fasting glucose    a1c is excellent  but elevated fbs ? from statin   on lower dose . consdier inc metformin but monitor for now    Can sign up for my chart  -Patient advised to return or notify health care team  immediately if symptoms worsen or persist or new concerns arise.  Patient Instructions  Check fasting blood sugars   For about a week.  Goal is below 120 for fasting  140 and below after eating.   Contact us if want to increase metformin to 1500 mg per day.  Your   Hg a1c is excellent at this time . For now stick with the same simva dose .   Check   blood pressure readings  About 3 x per week when  Sitting relaxed for 5 minutes.  Below  140/90.    Neta Mends. Panosh M.D.

## 2012-12-07 NOTE — Patient Instructions (Signed)
Check fasting blood sugars   For about a week.  Goal is below 120 for fasting  140 and below after eating.   Contact us if want to increase metformin to 1500 mg per day.  Your   Hg a1c is excellent at this time . For now stick with the same simva dose .   Check   blood pressure readings  About 3 x per week when  Sitting relaxed for 5 minutes.  Below  140/90.

## 2012-12-22 ENCOUNTER — Encounter: Payer: Self-pay | Admitting: Internal Medicine

## 2012-12-25 ENCOUNTER — Telehealth: Payer: Self-pay | Admitting: Family Medicine

## 2012-12-25 ENCOUNTER — Other Ambulatory Visit: Payer: Self-pay | Admitting: Internal Medicine

## 2012-12-25 MED ORDER — SIMVASTATIN 40 MG PO TABS: 40.0000 mg | ORAL_TABLET | Freq: Every day | ORAL | Status: DC

## 2012-12-25 NOTE — Telephone Encounter (Signed)
cannot tell what med requesting but ok to refill his reg meds  For 6 months

## 2012-12-25 NOTE — Telephone Encounter (Signed)
Patient notified that simvastatin 40mg  sent to [facility].

## 2013-02-13 ENCOUNTER — Other Ambulatory Visit: Payer: Self-pay

## 2013-03-31 ENCOUNTER — Other Ambulatory Visit (INDEPENDENT_AMBULATORY_CARE_PROVIDER_SITE_OTHER): Payer: BC Managed Care – PPO

## 2013-03-31 DIAGNOSIS — R7301 Impaired fasting glucose: Secondary | ICD-10-CM

## 2013-03-31 DIAGNOSIS — E785 Hyperlipidemia, unspecified: Secondary | ICD-10-CM

## 2013-03-31 LAB — LIPID PANEL
HDL: 46.7 mg/dL (ref >39.00)
LDL Cholesterol: 103 mg/dL — ABNORMAL HIGH (ref 0–99)
Total CHOL/HDL Ratio: 4
Triglycerides: 83 mg/dL (ref 0.0–149.0)

## 2013-03-31 LAB — HEMOGLOBIN A1C: Hgb A1c MFr Bld: 6 % (ref 4.6–6.5)

## 2013-04-07 ENCOUNTER — Encounter: Payer: Self-pay | Admitting: Internal Medicine

## 2013-04-07 ENCOUNTER — Ambulatory Visit (INDEPENDENT_AMBULATORY_CARE_PROVIDER_SITE_OTHER): Payer: BC Managed Care – PPO | Admitting: Internal Medicine

## 2013-04-07 VITALS — BP 160/96 | HR 62 | Temp 97.7°F | Wt 206.0 lb

## 2013-04-07 DIAGNOSIS — E785 Hyperlipidemia, unspecified: Secondary | ICD-10-CM

## 2013-04-07 DIAGNOSIS — I10 Essential (primary) hypertension: Secondary | ICD-10-CM

## 2013-04-07 DIAGNOSIS — R7301 Impaired fasting glucose: Secondary | ICD-10-CM

## 2013-04-07 MED ORDER — LISINOPRIL 10 MG PO TABS: 10.0000 mg | ORAL_TABLET | Freq: Every day | ORAL | Status: DC

## 2013-04-07 MED ORDER — METFORMIN HCL 500 MG PO TABS: 500.0000 mg | ORAL_TABLET | Freq: Three times a day (TID) | ORAL | Status: DC

## 2013-04-07 NOTE — Patient Instructions (Addendum)
Consider increase metformin to 3 500 mg per day to  Get fasting blood sugar to goal. Stay on same dose of simvastatin.  If Bp creeping up we can bvegin an acei nhibitor  Such as lisinopril 10 mg per day  .  This is a mild blood pressure medication good for heart and blood vessel protection.    Labs in another  3 months and follow up.

## 2013-04-07 NOTE — Progress Notes (Signed)
Chief Complaint  Patient presents with  . Follow-up    HPI: Patient comes in today for follow up of  multiple medical problems.  Monitoring bp at home and brings in readings  Most recently are at goal but  soe in 140 range  Pulse normal  No new sxc no cp sob  No se of metformin now  On 1000 per day  Is on  40 of sima instead of 80 as oper safety recs ROS: See pertinent positives and negatives per HPI.  Past Medical History  Diagnosis Date  . History of diverticulitis of colon 8/05  . Hyperlipidemia   . Hypertension     off med after lifestyle intervention contolled  . Hyperglycemia     Family History  Problem Relation Age of Onset  . Stroke Father     in hot weather  . Arthritis Neg Hx   . Stroke Other   . Breast cancer Mother     History   Social History  . Marital Status: Married    Spouse Name: N/A    Number of Children: N/A  . Years of Education: N/A   Social History Main Topics  . Smoking status: Never Smoker   . Smokeless tobacco: None  . Alcohol Use: Yes  . Drug Use: No  . Sexually Active: None   Other Topics Concern  . 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 Runs   Sleep adequate   40- 50 jours per week.   To do marathon in oct 13                    Outpatient Encounter Prescriptions as of 04/07/2013  Medication Sig Dispense Refill  . aspirin 81 MG tablet Take 81 mg by mouth daily.        . metFORMIN (GLUCOPHAGE) 500 MG tablet Take 1 tablet (500 mg total) by mouth 3 (three) times daily before meals.  270 tablet  3  . simvastatin (ZOCOR) 40 MG tablet Take 1 tablet (40 mg total) by mouth at bedtime.  90 tablet  1  . [DISCONTINUED] metFORMIN (GLUCOPHAGE) 500 MG tablet Take 500 mg by mouth 2 (two) times daily with a meal.       . lisinopril (PRINIVIL,ZESTRIL) 10 MG tablet Take 1 tablet (10 mg total) by mouth daily.   90 tablet  3   No facility-administered encounter medications on file as of 04/07/2013.    EXAM:  BP 160/96  Pulse 62  Temp(Src) 97.7 F (36.5 C) (Oral)  Wt 206 lb (93.441 kg)  BMI 28.94 kg/m2  SpO2 99%  Body mass index is 28.94 kg/(m^2).  GENERAL: vitals reviewed and listed above, alert, oriented, appears well hydrated and in no acute distress Repeat BP reading right 142/80   CV: HRRR, no clubbing cyanosis or  peripheral edema nl cap refill   MS: moves all extremities without noticeable focal  abnormality  PSYCH: pleasant and cooperative, no obvious depression or anxiety Lab Results  Component Value Date   WBC 4.9 08/20/2012   HGB 14.1 08/20/2012   HCT 42.9 08/20/2012   PLT 168.0 08/20/2012   GLUCOSE 135* 11/30/2012   CHOL 166 03/31/2013   TRIG 83.0 03/31/2013   HDL 46.70 03/31/2013   LDLCALC 103* 03/31/2013  ALT 29 08/20/2012   AST 23 08/20/2012   NA 141 11/30/2012   K 4.8 11/30/2012   CL 105 11/30/2012   CREATININE 0.9 11/30/2012   BUN 12 11/30/2012   CO2 28 11/30/2012   TSH 1.28 08/20/2012   PSA 1.08 05/11/2010   HGBA1C 6.0 03/31/2013   MICROALBUR 0.5 08/19/2011    ASSESSMENT AND PLAN:  Discussed the following assessment and plan:  HYPERLIPIDEMIA - improved readings   adequate with newer guidlines   HYPERTENSION - high normal at home and out of office pretty well documented but ? if creeping up can add acei ( think he did altace in the past )  Impaired fasting glucose - although a1c is stable fbs is creeping up and some in the 130 140 range  ok to try inc metformin to 1500 equiv per day  -Patient advised to return or notify health care team  if symptoms worsen or persist or new concerns arise.  Patient Instructions  Consider increase metformin to 3 500 mg per day to  Get fasting blood sugar to goal. Stay on same dose of simvastatin.  If Bp creeping up we can bvegin an acei nhibitor  Such as lisinopril 10 mg per day  .  This is a mild blood pressure medication good for heart  and blood vessel protection.    Labs in another  3 months and follow up.    Neta Mends. Vola Beneke M.D.

## 2013-06-25 ENCOUNTER — Other Ambulatory Visit (INDEPENDENT_AMBULATORY_CARE_PROVIDER_SITE_OTHER): Payer: BC Managed Care – PPO

## 2013-06-25 DIAGNOSIS — R7301 Impaired fasting glucose: Secondary | ICD-10-CM

## 2013-06-25 DIAGNOSIS — I10 Essential (primary) hypertension: Secondary | ICD-10-CM

## 2013-06-25 LAB — BASIC METABOLIC PANEL
BUN: 17 mg/dL (ref 6–23)
Creatinine, Ser: 0.9 mg/dL (ref 0.4–1.5)
GFR: 93.86 mL/min (ref >60.00)
Potassium: 5 mEq/L (ref 3.5–5.1)

## 2013-06-25 LAB — HEMOGLOBIN A1C: Hgb A1c MFr Bld: 6.1 % (ref 4.6–6.5)

## 2013-06-29 ENCOUNTER — Ambulatory Visit (INDEPENDENT_AMBULATORY_CARE_PROVIDER_SITE_OTHER): Payer: BC Managed Care – PPO | Admitting: Internal Medicine

## 2013-06-29 ENCOUNTER — Encounter: Payer: Self-pay | Admitting: Internal Medicine

## 2013-06-29 VITALS — BP 134/84 | HR 74 | Temp 98.0°F | Wt 198.0 lb

## 2013-06-29 DIAGNOSIS — I10 Essential (primary) hypertension: Secondary | ICD-10-CM

## 2013-06-29 DIAGNOSIS — R7301 Impaired fasting glucose: Secondary | ICD-10-CM

## 2013-06-29 MED ORDER — LISINOPRIL 5 MG PO TABS: 5.0000 mg | ORAL_TABLET | Freq: Every day | ORAL | Status: DC

## 2013-06-29 NOTE — Patient Instructions (Addendum)
Can decrease blood pressure med to 5 mg  To aavoid lows. Continue monitor  ocass  3 x per week or so.  Stay on same meds otherwise .  cpx with labs and  Hg a1c in  About 6 months

## 2013-06-29 NOTE — Progress Notes (Signed)
Chief Complaint  Patient presents with  . Follow-up    HPI: Patient comes in today for follow up of  multiple medical problems.  Since last visit we have added a low-dose ACE inhibitor for blood pressure. He is calm with a spread sheet of his blood pressures and his blood sugars some around exercise. Blood pressure is generally in the teen systolic in the 70s in ED diastolic. Occasionally having low blood pressures after exercise in the 90 range or 100 range. He denies any chest pain shortness of breath syncope with exercise. Blood sugars are ranging from 102 occasional spike of 190.  He's doing pretty well otherwise he is on 1500 mg of metformin without side effect. ROS: See pertinent positives and negatives per HPI.  Past Medical History  Diagnosis Date  . History of diverticulitis of colon 8/05  . Hyperlipidemia   . Hypertension     off med after lifestyle intervention contolled  . Hyperglycemia     Family History  Problem Relation Age of Onset  . Stroke Father     in hot weather  . Arthritis Neg Hx   . Stroke Other   . Breast cancer Mother     History   Social History  . Marital Status: Married    Spouse Name: N/A    Number of Children: N/A  . Years of Education: N/A   Social History Main Topics  . Smoking status: Never Smoker   . Smokeless tobacco: None  . Alcohol Use: Yes  . Drug Use: No  . Sexually Active: None   Other Topics Concern  . 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 Runs   Sleep adequate   40- 50 jours per week.   To do marathon in oct 13                    Outpatient Encounter Prescriptions as of 06/29/2013  Medication Sig Dispense Refill  . aspirin 81 MG tablet Take 81 mg by mouth daily.        Marland Kitchen lisinopril (PRINIVIL,ZESTRIL) 5 MG tablet Take 1 tablet (5 mg total) by mouth daily.  90 tablet  3  .  metFORMIN (GLUCOPHAGE) 500 MG tablet Take 1 tablet (500 mg total) by mouth 3 (three) times daily before meals.  270 tablet  3  . simvastatin (ZOCOR) 40 MG tablet Take 1 tablet (40 mg total) by mouth at bedtime.  90 tablet  1  . [DISCONTINUED] lisinopril (PRINIVIL,ZESTRIL) 10 MG tablet Take 1 tablet (10 mg total) by mouth daily.  90 tablet  3   No facility-administered encounter medications on file as of 06/29/2013.    EXAM:  BP 134/84  Pulse 74  Temp(Src) 98 F (36.7 C) (Oral)  Wt 198 lb (89.812 kg)  BMI 27.81 kg/m2  SpO2 98%  Body mass index is 27.81 kg/(m^2).  GENERAL: vitals reviewed and listed above, alert, oriented, appears well hydrated and in no acute distress MS: moves all extremities without noticeable focal  abnormality PSYCH: pleasant and cooperative, no obvious depression or anxiety Reviewed labs and spreadsheet Lab Results  Component Value Date   WBC 4.9 08/20/2012   HGB 14.1 08/20/2012   HCT 42.9 08/20/2012   PLT 168.0 08/20/2012   GLUCOSE 118* 06/25/2013  CHOL 166 03/31/2013   TRIG 83.0 03/31/2013   HDL 46.70 03/31/2013   LDLCALC 103* 03/31/2013   ALT 29 08/20/2012   AST 23 08/20/2012   NA 141 06/25/2013   K 5.0 06/25/2013   CL 107 06/25/2013   CREATININE 0.9 06/25/2013   BUN 17 06/25/2013   CO2 32 06/25/2013   TSH 1.28 08/20/2012   PSA 1.08 05/11/2010   HGBA1C 6.1 06/25/2013   MICROALBUR 0.5 08/19/2011    ASSESSMENT AND PLAN:  Discussed the following assessment and plan:  HYPERTENSION - Lower at home average 116/75 range occasional lows /decrease to 5 mg lisinopril/contact if needed  Impaired fasting glucose - Stable A1c continue on metformin 1500 mg a day and healthy exercise average blood sugar 128  -Patient advised to return or notify health care team  if symptoms worsen or persist or new concerns arise.  Patient Instructions  Can decrease blood pressure med to 5 mg  To aavoid lows. Continue monitor  ocass  3 x per week or so.  Stay on same meds otherwise  .  cpx with labs and  Hg a1c in  About 6 months    Wanda K. Panosh M.D.

## 2013-07-07 ENCOUNTER — Ambulatory Visit: Payer: BC Managed Care – PPO | Admitting: Internal Medicine

## 2013-07-19 ENCOUNTER — Other Ambulatory Visit: Payer: Self-pay | Admitting: Internal Medicine

## 2013-11-04 ENCOUNTER — Other Ambulatory Visit: Payer: Self-pay

## 2013-11-13 ENCOUNTER — Encounter (HOSPITAL_COMMUNITY): Payer: Self-pay | Admitting: Emergency Medicine

## 2013-11-13 ENCOUNTER — Emergency Department (HOSPITAL_COMMUNITY)
Admission: EM | Admit: 2013-11-13 | Discharge: 2013-11-13 | Disposition: A | Discharge status: Home / Self Care | Payer: BC Managed Care – PPO | Attending: Emergency Medicine | Admitting: Emergency Medicine

## 2013-11-13 DIAGNOSIS — E785 Hyperlipidemia, unspecified: Secondary | ICD-10-CM | POA: Insufficient documentation

## 2013-11-13 DIAGNOSIS — I10 Essential (primary) hypertension: Secondary | ICD-10-CM | POA: Insufficient documentation

## 2013-11-13 DIAGNOSIS — Z23 Encounter for immunization: Secondary | ICD-10-CM | POA: Insufficient documentation

## 2013-11-13 DIAGNOSIS — Z8719 Personal history of other diseases of the digestive system: Secondary | ICD-10-CM | POA: Insufficient documentation

## 2013-11-13 DIAGNOSIS — Z79899 Other long term (current) drug therapy: Secondary | ICD-10-CM | POA: Insufficient documentation

## 2013-11-13 DIAGNOSIS — Z7982 Long term (current) use of aspirin: Secondary | ICD-10-CM | POA: Insufficient documentation

## 2013-11-13 MED ORDER — RABIES IMMUNE GLOBULIN 150 UNIT/ML IM INJ
20.0000 [IU]/kg | INJECTION | INTRAMUSCULAR | Status: AC
  Administered 2013-11-13: 1800 [IU] via INTRAMUSCULAR
  Filled 2013-11-13: qty 12

## 2013-11-13 MED ORDER — RABIES VACCINE, PCEC IM SUSR
1.0000 mL | INTRAMUSCULAR | Status: AC
  Administered 2013-11-13: 1 mL via INTRAMUSCULAR
  Filled 2013-11-13: qty 1

## 2013-11-13 NOTE — ED Notes (Signed)
Pt from home c/o bat exposure 

## 2013-11-13 NOTE — ED Provider Notes (Signed)
CSN: [id number]     Arrival date & time 11/13/13  1814 History  This chart was scribed for non-physician practitioner, Teressa Lower, FNP,working with Candyce Churn, MD, by Karle Plumber, ED Scribe.  This patient was seen in room TR10C/TR10C and the patient's care was started at 6:21 PM.  Chief Complaint  Patient presents with  . Bat exposure    The history is provided by the patient. No language interpreter was used.   HPI Comments:  David Copeland is a 52 y.o. male who presents to the Emergency Department complaining of  bat exposure. Pt states there was a bat in his bedroom for an unknown amount of time. Pt states his wife contacted their PCP and was told that her and her husband need to be seen since the bat was located in their bedroom. He states he caught the bat and released it so he has no way of knowing if the bat was rabid or not. Pt denies any obvious puncture wounds to indicate pt was bitten.   Past Medical History  Diagnosis Date  . History of diverticulitis of colon 8/05  . Hyperlipidemia   . Hypertension     off med after lifestyle intervention contolled  . Hyperglycemia    Past Surgical History  Procedure Laterality Date  . Removed breast glands      damaged secondary to soccer  . Wisdom tooth extraction    . Adenoidectomy    . Tonsillectomy     Family History  Problem Relation Age of Onset  . Stroke Father     in hot weather  . Arthritis Neg Hx   . Stroke Other   . Breast cancer Mother    History  Substance Use Topics  . Smoking status: Never Smoker   . Smokeless tobacco: Not on file  . Alcohol Use: Yes    Review of Systems  Skin: Negative for wound.  All other systems reviewed and are negative.    Allergies  Atorvastatin  Home Medications   Current Outpatient Rx  Name  Route  Sig  Dispense  Refill  . aspirin 81 MG tablet   Oral   Take 81 mg by mouth daily.           Marland Kitchen lisinopril (PRINIVIL,ZESTRIL) 5 MG tablet   Oral    Take 1 tablet (5 mg total) by mouth daily.   90 tablet   3   . metFORMIN (GLUCOPHAGE) 500 MG tablet   Oral   Take 1 tablet (500 mg total) by mouth 3 (three) times daily before meals.   270 tablet   3   . simvastatin (ZOCOR) 40 MG tablet      TAKE 1 TABLET AT BEDTIME   90 tablet   1    Triage Vitals: BP 149/91  Pulse 69  Temp(Src) 98.7 F (37.1 C) (Oral)  Resp 20  Wt 197 lb 1 oz (89.387 kg)  SpO2 99% Physical Exam  Nursing note and vitals reviewed. Constitutional: He is oriented to person, place, and time. He appears well-developed and well-nourished. No distress.  HENT:  Head: Normocephalic and atraumatic.  Eyes: Conjunctivae are normal. No scleral icterus.  Neck: Neck supple.  Cardiovascular: Normal rate and intact distal pulses.   Pulmonary/Chest: Effort normal. No stridor. No respiratory distress.  Abdominal: Normal appearance. He exhibits no distension.  Neurological: He is alert and oriented to person, place, and time.  Skin: Skin is warm and dry. No rash noted.  No  puncture wounds noted.  Psychiatric: He has a normal mood and affect. His behavior is normal.    ED Course  Procedures (including critical care time) DIAGNOSTIC STUDIES: Oxygen Saturation is 99% on RA, normal by my interpretation.   COORDINATION OF CARE: 6:37 PM- Will contact [facility] to verify if pt and family can get remaining injections of the course there. Pt verbalizes understanding and agrees to plan.  Medications - No data to display  Labs Review Labs Reviewed - No data to display Imaging Review No results found.  EKG Interpretation   None       MDM   1. Need for prophylactic vaccination and inoculation against rabies    Rabies series started  I personally performed the services described in this documentation, which was scribed in my presence. The recorded information has been reviewed and is accurate.    Teressa Lower, NP 11/13/13 2137

## 2013-11-14 NOTE — ED Provider Notes (Signed)
Medical screening examination/treatment/procedure(s) were performed by non-physician practitioner and as supervising physician I was immediately available for consultation/collaboration.  Candyce Churn, MD 11/14/13 1124

## 2013-11-16 ENCOUNTER — Emergency Department (INDEPENDENT_AMBULATORY_CARE_PROVIDER_SITE_OTHER)
Admission: EM | Admit: 2013-11-16 | Discharge: 2013-11-16 | Disposition: A | Discharge status: Home / Self Care | Payer: BC Managed Care – PPO | Source: Home / Self Care

## 2013-11-16 ENCOUNTER — Encounter (HOSPITAL_COMMUNITY): Payer: Self-pay | Admitting: Emergency Medicine

## 2013-11-16 DIAGNOSIS — Z203 Contact with and (suspected) exposure to rabies: Secondary | ICD-10-CM

## 2013-11-16 MED ORDER — RABIES VACCINE, PCEC IM SUSR
INTRAMUSCULAR | Status: AC
  Filled 2013-11-16: qty 1

## 2013-11-16 MED ORDER — RABIES VACCINE, PCEC IM SUSR
1.0000 mL | Freq: Once | INTRAMUSCULAR | Status: AC
  Administered 2013-11-16: 1 mL via INTRAMUSCULAR

## 2013-11-16 NOTE — ED Notes (Signed)
Bat exposure Fri. Seen in ED on Sat and got first vaccine.  No bites noted.

## 2013-11-20 ENCOUNTER — Encounter (HOSPITAL_COMMUNITY): Payer: Self-pay | Admitting: Emergency Medicine

## 2013-11-20 ENCOUNTER — Emergency Department (INDEPENDENT_AMBULATORY_CARE_PROVIDER_SITE_OTHER)
Admission: EM | Admit: 2013-11-20 | Discharge: 2013-11-20 | Disposition: A | Discharge status: Home / Self Care | Payer: BC Managed Care – PPO | Source: Home / Self Care | Attending: Family Medicine | Admitting: Family Medicine

## 2013-11-20 DIAGNOSIS — Z203 Contact with and (suspected) exposure to rabies: Secondary | ICD-10-CM

## 2013-11-20 MED ORDER — RABIES VACCINE, PCEC IM SUSR
1.0000 mL | Freq: Once | INTRAMUSCULAR | Status: AC
  Administered 2013-11-20: 1 mL via INTRAMUSCULAR

## 2013-11-20 MED ORDER — RABIES VACCINE, PCEC IM SUSR
INTRAMUSCULAR | Status: AC
  Filled 2013-11-20: qty 1

## 2013-11-20 NOTE — ED Notes (Signed)
Day #7 of rabies series; NAD, no c/o 

## 2013-11-23 ENCOUNTER — Other Ambulatory Visit (INDEPENDENT_AMBULATORY_CARE_PROVIDER_SITE_OTHER): Payer: BC Managed Care – PPO

## 2013-11-23 DIAGNOSIS — Z0000 Encounter for general adult medical examination without abnormal findings: Secondary | ICD-10-CM

## 2013-11-23 LAB — HEPATIC FUNCTION PANEL
ALT: 50 U/L (ref 0–53)
Alkaline Phosphatase: 41 U/L (ref 39–117)
Bilirubin, Direct: 0.1 mg/dL (ref 0.0–0.3)
Total Bilirubin: 0.7 mg/dL (ref 0.3–1.2)
Total Protein: 7.1 g/dL (ref 6.0–8.3)

## 2013-11-23 LAB — BASIC METABOLIC PANEL
BUN: 17 mg/dL (ref 6–23)
Creatinine, Ser: 0.9 mg/dL (ref 0.4–1.5)
GFR: 93.7 mL/min (ref >60.00)
Potassium: 4.8 mEq/L (ref 3.5–5.1)

## 2013-11-23 LAB — CBC WITH DIFFERENTIAL/PLATELET
Eosinophils Relative: 1.4 % (ref 0.0–5.0)
HCT: 42.2 % (ref 39.0–52.0)
Monocytes Relative: 9.1 % (ref 3.0–12.0)
Neutrophils Relative %: 48.7 % (ref 43.0–77.0)
Platelets: 184 10*3/uL (ref 150.0–400.0)
WBC: 4.1 10*3/uL — ABNORMAL LOW (ref 4.5–10.5)

## 2013-11-23 LAB — MICROALBUMIN / CREATININE URINE RATIO
Creatinine,U: 123.3 mg/dL
Microalb Creat Ratio: 0.2 mg/g (ref 0.0–30.0)
Microalb, Ur: 0.3 mg/dL (ref 0.0–1.9)

## 2013-11-23 LAB — LIPID PANEL
Cholesterol: 166 mg/dL (ref 0–200)
LDL Cholesterol: 95 mg/dL (ref 0–99)

## 2013-11-23 LAB — TSH: TSH: 1.64 u[IU]/mL (ref 0.35–5.50)

## 2013-11-23 LAB — PSA: PSA: 1.14 ng/mL (ref 0.10–4.00)

## 2013-11-27 ENCOUNTER — Emergency Department (INDEPENDENT_AMBULATORY_CARE_PROVIDER_SITE_OTHER)
Admission: EM | Admit: 2013-11-27 | Discharge: 2013-11-27 | Disposition: A | Discharge status: Home / Self Care | Payer: BC Managed Care – PPO | Source: Home / Self Care

## 2013-11-27 ENCOUNTER — Encounter (HOSPITAL_COMMUNITY): Payer: Self-pay | Admitting: Emergency Medicine

## 2013-11-27 DIAGNOSIS — Z203 Contact with and (suspected) exposure to rabies: Secondary | ICD-10-CM

## 2013-11-27 MED ORDER — RABIES VACCINE, PCEC IM SUSR
INTRAMUSCULAR | Status: AC
  Filled 2013-11-27: qty 1

## 2013-11-27 MED ORDER — RABIES VACCINE, PCEC IM SUSR
1.0000 mL | Freq: Once | INTRAMUSCULAR | Status: AC
  Administered 2013-11-27: 1 mL via INTRAMUSCULAR

## 2013-11-27 MED ORDER — RABIES VACCINE, PCEC IM SUSR: 1.0000 mL | Freq: Once | INTRAMUSCULAR | Status: DC

## 2013-11-27 NOTE — ED Notes (Signed)
Pt is here for 4th rabies vaccination (day 14) Voices no new concerns Alert w/no signs of acute distress.  

## 2013-11-27 NOTE — ED Notes (Deleted)
Pt is here for 4th rabies vaccination (day 14) Voices no new concerns Alert w/no signs of acute distress.  

## 2013-12-03 ENCOUNTER — Encounter: Payer: BC Managed Care – PPO | Admitting: Internal Medicine

## 2013-12-20 ENCOUNTER — Ambulatory Visit (INDEPENDENT_AMBULATORY_CARE_PROVIDER_SITE_OTHER): Payer: BC Managed Care – PPO | Admitting: Internal Medicine

## 2013-12-20 ENCOUNTER — Encounter: Payer: Self-pay | Admitting: Internal Medicine

## 2013-12-20 VITALS — BP 136/80 | HR 89 | Temp 97.8°F | Ht 71.0 in | Wt 201.0 lb

## 2013-12-20 DIAGNOSIS — R7989 Other specified abnormal findings of blood chemistry: Secondary | ICD-10-CM

## 2013-12-20 DIAGNOSIS — R945 Abnormal results of liver function studies: Secondary | ICD-10-CM | POA: Insufficient documentation

## 2013-12-20 DIAGNOSIS — I10 Essential (primary) hypertension: Secondary | ICD-10-CM

## 2013-12-20 DIAGNOSIS — E785 Hyperlipidemia, unspecified: Secondary | ICD-10-CM

## 2013-12-20 DIAGNOSIS — R141 Gas pain: Secondary | ICD-10-CM

## 2013-12-20 DIAGNOSIS — R140 Abdominal distension (gaseous): Secondary | ICD-10-CM | POA: Insufficient documentation

## 2013-12-20 DIAGNOSIS — Z0000 Encounter for general adult medical examination without abnormal findings: Secondary | ICD-10-CM

## 2013-12-20 DIAGNOSIS — R7301 Impaired fasting glucose: Secondary | ICD-10-CM

## 2013-12-20 LAB — IBC PANEL
Iron: 45 ug/dL (ref 42–165)
Saturation Ratios: 11.7 % — ABNORMAL LOW (ref 20.0–50.0)
Transferrin: 273.9 mg/dL (ref 212.0–360.0)

## 2013-12-20 LAB — HEPATIC FUNCTION PANEL
ALT: 34 U/L (ref 0–53)
AST: 30 U/L (ref 0–37)
Alkaline Phosphatase: 42 U/L (ref 39–117)
Bilirubin, Direct: 0.1 mg/dL (ref 0.0–0.3)
Total Bilirubin: 0.6 mg/dL (ref 0.3–1.2)
Total Protein: 7.4 g/dL (ref 6.0–8.3)

## 2013-12-20 NOTE — Progress Notes (Signed)
Chief Complaint  Patient presents with  . Annual Exam    HPI: Patient comes in today for Preventive Health Care visit    No change metformin .  Sugars controlled when checking   Since last visit he underwent rabies vaccination because of was found in their bedroom.  No major injuries cardiovascular pulmonary symptoms new medications supplements just takes vitamin C for general health  Wife was diagnosed with stage 0 breast cancer doing well now. To undergo radiation.  No family history of liver problems may have had a marathon a month ago and whether excess exercise  Health Maintenance  Topic Date Due  . Colonoscopy  10/24/2013  . Influenza Vaccine  07/30/2014  . Tetanus/tdap  05/16/2019   Health Maintenance Review   ROS:  GEN/ HEENT: No fever, significant weight changes sweats headaches vision problems hearing changes, CV/ PULM; No chest pain shortness of breath cough, syncope,edema  change in exercise tolerance. GI /GU: No adominal pain, vomiting, change in bowel habits. No blood in the stool. No significant GU symptoms. Bloating left side after eating x mas party. Getting better similar to what he had years ago when he had his colonoscopy. No blood in stool no change in bowel habits. Thinks it might be garlic or other rich foods. SKIN/HEME: ,no acute skin rashes suspicious lesions or bleeding. No lymphadenopathy, nodules, masses.  NEURO/ PSYCH:  No neurologic signs such as weakness numbness. No depression anxiety. IMM/ Allergy: No unusual infections.  Allergy .   REST of 12 system review negative except as per HPI   Past Medical History  Diagnosis Date  . History of diverticulitis of colon 8/05  . Hyperlipidemia   . Hypertension     off med after lifestyle intervention contolled  . Hyperglycemia     Family History  Problem Relation Age of Onset  . Stroke Father     in hot weather  . Arthritis Neg Hx   . Stroke Other   . Breast cancer Mother    Past Surgical  History  Procedure Laterality Date  . Removed breast glands      damaged secondary to soccer  . Wisdom tooth extraction    . Adenoidectomy    . Tonsillectomy       History   Social History  . Marital Status: Married    Spouse Name: N/A    Number of Children: N/A  . Years of Education: N/A   Social History Main Topics  . Smoking status: Never Smoker   . Smokeless tobacco: None  . Alcohol Use: Yes     Comment: occasional  . Drug Use: No  . Sexual Activity: None   Other Topics Concern  . None   Social History Narrative   Married with children   hhof 5   2 dogs    Former smoker   Regular exercise-yes  does marathon    Is [place]   No est. Runs   Sleep adequate   40- 50 jours per week.                   Outpatient Encounter Prescriptions as of 12/20/2013  Medication Sig  . aspirin 81 MG tablet Take 81 mg by mouth daily.    Marland Kitchen lisinopril (PRINIVIL,ZESTRIL) 5 MG tablet Take 1 tablet (5 mg total) by mouth daily.  . metFORMIN (GLUCOPHAGE) 500 MG tablet 3 (three) times daily before meals. 2 tabs q am and 1 q hs.  . simvastatin (ZOCOR)  40 MG tablet Take 40 mg by mouth every morning.  . [DISCONTINUED] metFORMIN (GLUCOPHAGE) 500 MG tablet Take 1 tablet (500 mg total) by mouth 3 (three) times daily before meals.    EXAM:  BP 136/80  Pulse 89  Temp(Src) 97.8 F (36.6 C) (Oral)  Ht 5\' 11"  (1.803 m)  Wt 201 lb (91.173 kg)  BMI 28.05 kg/m2  SpO2 98%  Body mass index is 28.05 kg/(m^2).  Physical Exam: Vital signs reviewed ZOX:WRUE is a well-developed well-nourished alert cooperative   male who appears  stated age in no acute distress.  HEENT: normocephalic atraumatic , Eyes: PERRL EOM's full, conjunctiva clear, Nares: paten,t no deformity discharge or tenderness., Ears: no deformity EAC's clear TMs with normal landmarks. Mouth: clear OP, no lesions, edema.  Moist mucous membranes. Dentition in adequate repair. NECK: supple without masses, thyromegaly or  bruits. CHEST/PULM:  Clear to auscultation and percussion breath sounds equal no wheeze , rales or rhonchi. No chest wall deformities or tenderness. CV: PMI is nondisplaced, S1 S2 no gallops, murmurs, rubs. Peripheral pulses are full without delay.No JVD .  ABDOMEN: Bowel sounds normal nontender  No guard or rebound, no hepato splenomegal no CVA tenderness.  No hernia. Extremtities:  No clubbing cyanosis or edema, no acute joint swelling or redness no focal atrophy NEURO:  Oriented x3, cranial nerves 3-12 appear to be intact, no obvious focal weakness,gait within normal limits no abnormal reflexes or asymmetrical sensation to monofilament intact both feet no unusual calluses pulses intact SKIN: No acute rashes normal turgor, color, no bruising or petechiae. Son changes no acute findings PSYCH: Oriented, good eye contact, no obvious depression anxiety, cognition and judgment appear normal. Thickened toenails LN: no cervical axillary inguinal adenopathy  Lab Results  Component Value Date   WBC 4.1* 11/23/2013   HGB 14.5 11/23/2013   HCT 42.2 11/23/2013   PLT 184.0 11/23/2013   GLUCOSE 110* 11/23/2013   CHOL 166 11/23/2013   TRIG 107.0 11/23/2013   HDL 49.90 11/23/2013   LDLCALC 95 11/23/2013   ALT 34 12/20/2013   AST 30 12/20/2013   NA 140 11/23/2013   K 4.8 11/23/2013   CL 104 11/23/2013   CREATININE 0.9 11/23/2013   BUN 17 11/23/2013   CO2 28 11/23/2013   TSH 1.64 11/23/2013   PSA 1.14 11/23/2013   HGBA1C 6.2 11/23/2013   MICROALBUR 0.3 11/23/2013    ASSESSMENT AND PLAN:  Discussed the following assessment and plan:  Encounter for preventive health examination  Unspecified essential hypertension - Controlled may be creeping up after decrease in medication dose will monitor  Other and unspecified hyperlipidemia  Impaired fasting glucose - Stable prediabetic range  Abnormal LFTs - One-time elevation of AST normal ALT repeat screening - Plan: Hepatic function panel,  Ferritin, IBC panel, Hepatitis B surface antibody, Hepatitis B surface antigen, Hepatitis C antibody  Abdominal bloating - History of same left side due for colonoscopy soon see Dr. Loreta Ave after the first of the year was under a copy of my notes other followup depending on labs  Patient Care Team: Madelin Headings, MD as PCP - General Patient Instructions  You exam is normal. Repeat liver panel and tests. Will notify you  of labs when available. Fu depending on results. Monitor BP readings 3 x per week for 2 weeks  And send in readings   If ok no change in meds.  Continue lifestyle intervention healthy eating and exercise . You are due for colonoscopy in 2015 but  if bloating or liver tests still abnormal will get Dr. Loreta Ave to see you  Earlier.     Neta Mends. David Copeland M.D. Pre visit review using our clinic review tool, if applicable. No additional management support is needed unless otherwise documented below in the visit note.

## 2013-12-20 NOTE — Patient Instructions (Signed)
You exam is normal. Repeat liver panel and tests. Will notify you  of labs when available. Fu depending on results. Monitor BP readings 3 x per week for 2 weeks  And send in readings   If ok no change in meds.  Continue lifestyle intervention healthy eating and exercise . You are due for colonoscopy in 2015 but if bloating or liver tests still abnormal will get Dr. Loreta Ave to see you  Earlier.

## 2013-12-21 LAB — HEPATITIS B SURFACE ANTIBODY,QUALITATIVE: Hep B S Ab: NEGATIVE

## 2013-12-21 LAB — HEPATITIS C ANTIBODY: HCV Ab: NEGATIVE

## 2014-02-07 ENCOUNTER — Other Ambulatory Visit: Payer: Self-pay | Admitting: Internal Medicine

## 2014-02-23 ENCOUNTER — Encounter: Payer: Self-pay | Admitting: Internal Medicine

## 2014-04-05 ENCOUNTER — Emergency Department (HOSPITAL_COMMUNITY)
Admission: EM | Admit: 2014-04-05 | Discharge: 2014-04-05 | Disposition: A | Discharge status: Home / Self Care | Payer: BC Managed Care – PPO | Source: Home / Self Care | Attending: Emergency Medicine | Admitting: Emergency Medicine

## 2014-04-05 ENCOUNTER — Encounter (HOSPITAL_COMMUNITY): Payer: Self-pay | Admitting: Emergency Medicine

## 2014-04-05 ENCOUNTER — Telehealth: Payer: Self-pay | Admitting: Internal Medicine

## 2014-04-05 DIAGNOSIS — R109 Unspecified abdominal pain: Secondary | ICD-10-CM

## 2014-04-05 DIAGNOSIS — N390 Urinary tract infection, site not specified: Secondary | ICD-10-CM

## 2014-04-05 LAB — POCT URINALYSIS DIP (DEVICE)
BILIRUBIN URINE: NEGATIVE
Glucose, UA: NEGATIVE mg/dL
HGB URINE DIPSTICK: NEGATIVE
Ketones, ur: NEGATIVE mg/dL
NITRITE: NEGATIVE
Protein, ur: NEGATIVE mg/dL
Specific Gravity, Urine: 1.01 (ref 1.005–1.030)
Urobilinogen, UA: 0.2 mg/dL (ref 0.0–1.0)
pH: 6.5 (ref 5.0–8.0)

## 2014-04-05 MED ORDER — HYDROCODONE-ACETAMINOPHEN 5-325 MG PO TABS: 2.0000 | ORAL_TABLET | ORAL | Status: DC | PRN

## 2014-04-05 MED ORDER — CIPROFLOXACIN HCL 500 MG PO TABS: 500.0000 mg | ORAL_TABLET | Freq: Two times a day (BID) | ORAL | Status: DC

## 2014-04-05 NOTE — ED Notes (Signed)
Flank pain, had an episode last week with difficulty urinating. Reports episode last week of restricted urinae flow.  Now pain in left flank, and frequent urination

## 2014-04-05 NOTE — Telephone Encounter (Signed)
Patient Information:  Caller Name: Chriss Czar  Phone: [phone]  Patient: David Copeland, David Copeland  Gender: Male  DOB: 06-06-1961  Age: 53 Years  PCP: Shanon Ace (Family Practice)  Office Follow Up:  Does the office need to follow up with this patient?: Yes  Instructions For The Office: Appts. are full at [facility]. Please advise if the pt. may go to [facility].  RN Note:  Will obtain appt. for this pt. No appts. at [facility] or [place]. Please Advise if the pt. can go to [facility].  Symptoms  Reason For Call & Symptoms: C/o abdominal pain. No fever. Having bilateral flank pain. C/o urinary frequency and pressure.  Also having diarrhea.  Reviewed Health History In EMR: Yes  Reviewed Medications In EMR: Yes  Reviewed Allergies In EMR: Yes  Reviewed Surgeries / Procedures: Yes  Date of Onset of Symptoms: 03/28/2014  Guideline(s) Used:  Urination Pain - Male  Disposition Per Guideline:   Go to Office Now  Reason For Disposition Reached:   Side (flank) or lower back pain present  Advice Given:  Fluids  : Drink extra fluids (Reason: to produce a dilute, nonirritating urine).  Call Back If:  You become worse.  Patient Will Follow Care Advice:  YES

## 2014-04-05 NOTE — Discharge Instructions (Signed)
Urinary Tract Infection Urinary tract infections (UTIs) can develop anywhere along your urinary tract. Your urinary tract is your body's drainage system for removing wastes and extra water. Your urinary tract includes two kidneys, two ureters, a bladder, and a urethra. Your kidneys are a pair of bean-shaped organs. Each kidney is about the size of your fist. They are located below your ribs, one on each side of your spine. CAUSES Infections are caused by microbes, which are microscopic organisms, including fungi, viruses, and bacteria. These organisms are so small that they can only be seen through a microscope. Bacteria are the microbes that most commonly cause UTIs. SYMPTOMS  Symptoms of UTIs may vary by age and gender of the patient and by the location of the infection. Symptoms in young women typically include a frequent and intense urge to urinate and a painful, burning feeling in the bladder or urethra during urination. Older women and men are more likely to be tired, shaky, and weak and have muscle aches and abdominal pain. A fever may mean the infection is in your kidneys. Other symptoms of a kidney infection include pain in your back or sides below the ribs, nausea, and vomiting. DIAGNOSIS To diagnose a UTI, your caregiver will ask you about your symptoms. Your caregiver also will ask to provide a urine sample. The urine sample will be tested for bacteria and white blood cells. White blood cells are made by your body to help fight infection. TREATMENT  Typically, UTIs can be treated with medication. Because most UTIs are caused by a bacterial infection, they usually can be treated with the use of antibiotics. The choice of antibiotic and length of treatment depend on your symptoms and the type of bacteria causing your infection. HOME CARE INSTRUCTIONS  If you were prescribed antibiotics, take them exactly as your caregiver instructs you. Finish the medication even if you feel better after you  have only taken some of the medication.  Drink enough water and fluids to keep your urine clear or pale yellow.  Avoid caffeine, tea, and carbonated beverages. They tend to irritate your bladder.  Empty your bladder often. Avoid holding urine for long periods of time.  Empty your bladder before and after sexual intercourse.  After a bowel movement, women should cleanse from front to back. Use each tissue only once. SEEK MEDICAL CARE IF:   You have back pain.  You develop a fever.  Your symptoms do not begin to resolve within 3 days. SEEK IMMEDIATE MEDICAL CARE IF:   You have severe back pain or lower abdominal pain.  You develop chills.  You have nausea or vomiting.  You have continued burning or discomfort with urination. MAKE SURE YOU:   Understand these instructions.  Will watch your condition.  Will get help right away if you are not doing well or get worse. Document Released: 09/25/2005 Document Revised: 06/16/2012 Document Reviewed: 01/24/2012 [facility] Patient Information 2014 [place]. Flank Pain Flank pain refers to pain that is located on the side of the body between the upper abdomen and the back. The pain may occur over a short period of time (acute) or may be long-term or reoccurring (chronic). It may be mild or severe. Flank pain can be caused by many things. CAUSES  Some of the more common causes of flank pain include:  Muscle strains.   Muscle spasms.   A disease of your spine (vertebral disk disease).   A lung infection (pneumonia).   Fluid around your lungs (  pulmonary edema).   A kidney infection.   Kidney stones.   A very painful skin rash caused by the chickenpox virus (shingles).   Gallbladder disease.  [place]ry care will depend on the cause of your pain. In general,  Rest as directed by your caregiver.  Drink enough fluids to keep your urine clear or pale yellow.  Only take over-the-counter or  prescription medicines as directed by your caregiver. Some medicines may help relieve the pain.  Tell your caregiver about any changes in your pain.  Follow up with your caregiver as directed. SEEK IMMEDIATE MEDICAL CARE IF:   Your pain is not controlled with medicine.   You have new or worsening symptoms.  Your pain increases.   You have abdominal pain.   You have shortness of breath.   You have persistent nausea or vomiting.   You have swelling in your abdomen.   You feel faint or pass out.   You have blood in your urine.  You have a fever or persistent symptoms for more than 2 3 days.  You have a fever and your symptoms suddenly get worse. MAKE SURE YOU:   Understand these instructions.  Will watch your condition.  Will get help right away if you are not doing well or get worse. Document Released: 02/06/2006 Document Revised: 09/09/2012 Document Reviewed: 07/30/2012 [facility] Patient Information 2014 [place].

## 2014-04-05 NOTE — Telephone Encounter (Signed)
Spoke to the pt.  He will take CAN advise and go to [facility].  Instructed the pt to call back if I could be of any further assistance.

## 2014-04-05 NOTE — ED Provider Notes (Signed)
CSN: [id number]     Arrival date & time 04/05/14  1456 History   First MD Initiated Contact with Patient 04/05/14 1727     Chief Complaint  Patient presents with  . Flank Pain   (Consider location/radiation/quality/duration/timing/severity/associated sxs/prior Treatment) Patient is a 53 y.o. male presenting with flank pain. The history is provided by the patient. No language interpreter was used.  Flank Pain This is a new problem. The problem occurs constantly. The problem has not changed since onset.Nothing aggravates the symptoms. Nothing relieves the symptoms. He has tried nothing for the symptoms.    Past Medical History  Diagnosis Date  . History of diverticulitis of colon 8/05  . Hyperlipidemia   . Hypertension     off med after lifestyle intervention contolled  . Hyperglycemia    Past Surgical History  Procedure Laterality Date  . Removed breast glands      damaged secondary to soccer  . Wisdom tooth extraction    . Adenoidectomy    . Tonsillectomy     Family History  Problem Relation Age of Onset  . Stroke Father     in hot weather  . Arthritis Neg Hx   . Stroke Other   . Breast cancer Mother    History  Substance Use Topics  . Smoking status: Never Smoker   . Smokeless tobacco: Not on file  . Alcohol Use: Yes     Comment: occasional    Review of Systems  Genitourinary: Positive for flank pain.  All other systems reviewed and are negative.    Allergies  Atorvastatin  Home Medications   Current Outpatient Rx  Name  Route  Sig  Dispense  Refill  . aspirin 81 MG tablet   Oral   Take 81 mg by mouth daily.           . ciprofloxacin (CIPRO) 500 MG tablet   Oral   Take 1 tablet (500 mg total) by mouth every 12 (twelve) hours.   14 tablet   0   . HYDROcodone-acetaminophen (NORCO/VICODIN) 5-325 MG per tablet   Oral   Take 2 tablets by mouth every 4 (four) hours as needed for moderate pain.   20 tablet   0   . lisinopril (PRINIVIL,ZESTRIL) 5 MG  tablet   Oral   Take 1 tablet (5 mg total) by mouth daily.   90 tablet   3   . metFORMIN (GLUCOPHAGE) 500 MG tablet      3 (three) times daily before meals. 2 tabs q am and 1 q hs.         . simvastatin (ZOCOR) 40 MG tablet   Oral   Take 40 mg by mouth every morning.         . simvastatin (ZOCOR) 40 MG tablet      TAKE 1 TABLET AT BEDTIME   90 tablet   2    BP 158/88  Pulse 66  Temp(Src) 98.2 F (36.8 C) (Oral)  Resp 16  SpO2 98% Physical Exam  Nursing note and vitals reviewed. Constitutional: He appears well-developed and well-nourished.  HENT:  Head: Normocephalic.  Eyes: Pupils are equal, round, and reactive to light.  Neck: Normal range of motion.  Cardiovascular: Normal rate.   Pulmonary/Chest: Effort normal and breath sounds normal.  Abdominal: Soft.  Musculoskeletal: He exhibits tenderness.  Neurological: He is alert. He has normal reflexes.  Skin: Skin is warm.  Psychiatric: He has a normal mood and affect.  ED Course  Procedures (including critical care time) Labs Review Labs Reviewed  POCT URINALYSIS DIP (DEVICE) - Abnormal; Notable for the following:    Leukocytes, UA TRACE (*)    All other components within normal limits  URINE CULTURE   Imaging Review No results found.   MDM   1. Flank pain   2. UTI (lower urinary tract infection)    See Dr. Regis Bill for evaluation in 2-3 days    Fransico Meadow, PA-C 04/05/14 1818

## 2014-04-06 NOTE — ED Provider Notes (Signed)
Medical screening examination/treatment/procedure(s) were performed by non-physician practitioner and as supervising physician I was immediately available for consultation/collaboration.  Philipp Deputy, M.D.  Harden Mo, MD 04/06/14 [phone]

## 2014-04-07 LAB — URINE CULTURE: Colony Count: >=100000

## 2014-04-07 NOTE — ED Notes (Signed)
Urine culture: >100,000 colonies Enterococcus.  Pt. treated with Cipro-not on sensitivity.  Lab shown to Willeen Cass NP.  She said it is adequate and pt. is scheduled to f/u with his PCP in 2-3 days. Hanley Seamen Fusaye Wachtel 04/07/2014

## 2014-06-04 ENCOUNTER — Other Ambulatory Visit: Payer: Self-pay | Admitting: Internal Medicine

## 2014-06-06 NOTE — Telephone Encounter (Signed)
Ok x 6 months  He has ov on books Labs bmp hgba1c and cbcdiff

## 2014-06-06 NOTE — Telephone Encounter (Signed)
Pt should be coming in for repeat lab work and follow up.  What lab work?

## 2014-06-07 NOTE — Telephone Encounter (Signed)
Sent to the pharmacy by e-scribe. 

## 2014-06-17 ENCOUNTER — Telehealth: Payer: Self-pay | Admitting: Internal Medicine

## 2014-06-17 DIAGNOSIS — R7989 Other specified abnormal findings of blood chemistry: Secondary | ICD-10-CM

## 2014-06-17 DIAGNOSIS — R7301 Impaired fasting glucose: Secondary | ICD-10-CM

## 2014-06-17 DIAGNOSIS — R945 Abnormal results of liver function studies: Secondary | ICD-10-CM

## 2014-06-17 NOTE — Telephone Encounter (Signed)
Pt states he was suppose to have a lab appointment in June.  Can you please verify if he is to have a lab or f/u appointment?? If he needs lab visit, can you please enter the orders.

## 2014-06-20 ENCOUNTER — Ambulatory Visit: Payer: BC Managed Care – PPO | Admitting: Internal Medicine

## 2014-06-20 NOTE — Telephone Encounter (Signed)
Hg a1c  lft  Pre visit

## 2014-06-20 NOTE — Telephone Encounter (Signed)
What lab work does the patient need?

## 2014-06-21 NOTE — Telephone Encounter (Signed)
LMOM for pt to call office and schedule lab appt and OV.

## 2014-06-21 NOTE — Telephone Encounter (Signed)
I have placed the orders for lab work.  Please make lab appt and follow up with WP.  Thanks!

## 2014-08-06 ENCOUNTER — Other Ambulatory Visit: Payer: Self-pay | Admitting: Internal Medicine

## 2014-08-08 NOTE — Telephone Encounter (Signed)
Refilled and sent by e-scribe.  Pt due for CPE in Dec.  Will mail a letter as a reminder.

## 2014-11-09 ENCOUNTER — Telehealth: Payer: Self-pay | Admitting: Internal Medicine

## 2014-11-09 NOTE — Telephone Encounter (Signed)
Patient Information:  Caller Name: Abelino  Phone: [phone]  Patient: David Copeland, David Copeland  Gender: Male  DOB: [birth year]  Age: 53 Years  PCP: Shanon Ace [facility])  Office Follow Up:  Does the office need to follow up with this patient?: No  Instructions For The Office: N/A   Symptoms  Reason For Call & Symptoms: 11/05/14 he was working around the house.  Now has pain under the ribcage/armpit that is not going away.   He took Ibuprofen 2 tabs Q 6 hrs.  Rates 4-5/10 on pain scale.  Reviewed Health History In EMR: Yes  Reviewed Medications In EMR: Yes  Reviewed Allergies In EMR: Yes  Reviewed Surgeries / Procedures: Yes  Date of Onset of Symptoms: 11/05/2014  Treatments Tried: Ibuprofen  Treatments Tried Worked: No  Guideline(s) Used:  Back Pain  Disposition Per Guideline:   See Today or Tomorrow in Office  Reason For Disposition Reached:   Age > 59 and no history of prior similar back pain  Advice Given:  N/A  Patient Will Follow Care Advice:  YES  Advised he may take Ibuprofen 800 mg 1 PO Q 8 hrs x 24 hrs.   Appointment Scheduled:  11/10/2014 09:15:00 Appointment Scheduled Provider:  Shanon Ace [facility])

## 2014-11-10 ENCOUNTER — Encounter: Payer: Self-pay | Admitting: Internal Medicine

## 2014-11-10 ENCOUNTER — Ambulatory Visit (INDEPENDENT_AMBULATORY_CARE_PROVIDER_SITE_OTHER)
Admission: RE | Admit: 2014-11-10 | Discharge: 2014-11-10 | Disposition: A | Discharge status: Home / Self Care | Payer: BC Managed Care – PPO | Source: Ambulatory Visit | Attending: Internal Medicine | Admitting: Internal Medicine

## 2014-11-10 ENCOUNTER — Ambulatory Visit (INDEPENDENT_AMBULATORY_CARE_PROVIDER_SITE_OTHER): Payer: BC Managed Care – PPO | Admitting: Internal Medicine

## 2014-11-10 VITALS — BP 166/90 | Temp 98.7°F | Ht 71.0 in | Wt 197.0 lb

## 2014-11-10 DIAGNOSIS — I10 Essential (primary) hypertension: Secondary | ICD-10-CM

## 2014-11-10 DIAGNOSIS — R0789 Other chest pain: Secondary | ICD-10-CM

## 2014-11-10 DIAGNOSIS — R0781 Pleurodynia: Secondary | ICD-10-CM

## 2014-11-10 NOTE — Patient Instructions (Signed)
This appears to be localized chest wall pain because of its location context and otherwise normal exam. Can get strain of the muscle between the rib cage is in the pectoral muscles  However I agree that it was an unusual onset. Your lungs and heart sounds normal We will get a chest and rib x-ray Sports medicine opinion about running and continuing activity. Okay to continue on the ibuprofen for up to a week to  Help resolution.  Chest Wall Pain Chest wall pain is pain in or around the bones and muscles of your chest. It may take up to 6 weeks to get better. It may take longer if you must stay physically active in your work and activities.  CAUSES  Chest wall pain may happen on its own. However, it may be caused by:  A viral illness like the flu.  Injury.  Coughing.  Exercise.  Arthritis.  Fibromyalgia.  Shingles. HOME CARE INSTRUCTIONS   Avoid overtiring physical activity. Try not to strain or perform activities that cause pain. This includes any activities using your chest or your abdominal and side muscles, especially if heavy weights are used.  Put ice on the sore area.  Put ice in a plastic bag.  Place a towel between your skin and the bag.  Leave the ice on for 15-20 minutes per hour while awake for the first 2 days.  Only take over-the-counter or prescription medicines for pain, discomfort, or fever as directed by your caregiver. SEEK IMMEDIATE MEDICAL CARE IF:   Your pain increases, or you are very uncomfortable.  You have a fever.  Your chest pain becomes worse.  You have new, unexplained symptoms.  You have nausea or vomiting.  You feel sweaty or lightheaded.  You have a cough with phlegm (sputum), or you cough up blood. MAKE SURE YOU:   Understand these instructions.  Will watch your condition.  Will get help right away if you are not doing well or get worse. Document Released: 12/16/2005 Document Revised: 03/09/2012 Document Reviewed:  08/12/2011 [facility] Patient Information 2015 [place], [place]. This information is not intended to replace advice given to you by your health care provider. Make sure you discuss any questions you have with your health care provider.

## 2014-11-10 NOTE — Progress Notes (Signed)
Pre visit review using our clinic review tool, if applicable. No additional management support is needed unless otherwise documented below in the visit note.  Chief Complaint  Patient presents with  . Flank Pain    Right side under ribs.  David Copeland    HPI: Patient David Copeland  comes in today for SDA for  new problem evaluation.  Onset  Sat5 days ago   Felt pulling right lat chest  And then excruciating  Pain  Dec activity   Then not as bad  Sleeping ok.  When ran on Tuesday  Increase then.  Putting on upper body and small movement.  ? Catch feeling.  Nl bms.  Only activity predating painPutting up door  .  Reaching lifting .did not excess of use or specific injury Taking advil  Or aleve.  Sleeps through it.  He was supposed to do marathon in [place] this weekend but decided not to because of the pain when he runs. ROS: See pertinent positives and negatives per HPI.change in GI habits blood pressures been good at home in the  120  130  range.  Past Medical History  Diagnosis Date  . History of diverticulitis of colon 8/05  . Hyperlipidemia   . Hypertension     off med after lifestyle intervention contolled  . Hyperglycemia     Family History  Problem Relation Age of Onset  . Stroke Father     in hot weather  . Arthritis Neg Hx   . Stroke Other   . Breast cancer Mother     History   Social History  . Marital Status: Married    Spouse Name: N/A    Number of Children: N/A  . Years of Education: N/A   Social History Main Topics  . Smoking status: Never Smoker   . Smokeless tobacco: None  . Alcohol Use: Yes     Comment: occasional  . Drug Use: No  . Sexual Activity: None   Other Topics Concern  . None   Social History Narrative   Married with children   hhof 5   2 dogs    Former smoker   Regular exercise-yes  does marathon    Is [place]   No est. Runs   Sleep adequate   40- 50 jours per week.                   Outpatient Encounter  Prescriptions as of 11/10/2014  Medication Sig  . aspirin 81 MG tablet Take 81 mg by mouth daily.    Marland Kitchen lisinopril (PRINIVIL,ZESTRIL) 5 MG tablet TAKE 1 TABLET DAILY  . metFORMIN (GLUCOPHAGE) 500 MG tablet 3 (three) times daily before meals. 2 tabs q am and 1 q hs.  . simvastatin (ZOCOR) 40 MG tablet TAKE 1 TABLET AT BEDTIME  . [DISCONTINUED] ciprofloxacin (CIPRO) 500 MG tablet Take 1 tablet (500 mg total) by mouth every 12 (twelve) hours.  . [DISCONTINUED] HYDROcodone-acetaminophen (NORCO/VICODIN) 5-325 MG per tablet Take 2 tablets by mouth every 4 (four) hours as needed for moderate pain.  . [DISCONTINUED] metFORMIN (GLUCOPHAGE) 500 MG tablet TAKE 1 TABLET THREE TIMES A DAY BEFORE MEALS  . [DISCONTINUED] simvastatin (ZOCOR) 40 MG tablet Take 40 mg by mouth every morning.    EXAM:  BP 166/90 mmHg  Temp(Src) 98.7 F (37.1 C) (Oral)  Ht 5\' 11"  (1.803 m)  Wt 197 lb (89.359 kg)  BMI 27.49 kg/m2  Body mass index is 27.49 kg/(m^2).  GENERAL: vitals reviewed and listed above, alert, oriented, appears well hydrated and in no acute distress HEENT: atraumatic, conjunctiva  clear, no obvious abnormalities on inspection of external nose and ears OP : no lesion edema or exudate  NECK: no obvious masses on inspection palpation  LUNGS: clear to auscultation bilaterally, no wheezes, rales or rhonchi, good air movement Chest wall no deformity has a local rib area tenderness about T7 anterolateral line no crepitus no radiation no rash CV: HRRR, no clubbing cyanosis or  peripheral edema nl cap refill  Abdomen soft without acute megaly guarding rebound MS: moves all extremities without noticeable focal  abnormality PSYCH: pleasant and cooperative, no obvious depression or anxiety  ASSESSMENT AND PLAN:  Discussed the following assessment and plan:  Right-sided chest wall pain - Plan: DG Chest 2 View, DG Ribs Unilateral Right, Ambulatory referral to Sports Medicine  Rib pain on right side - Plan: DG  Chest 2 View, DG Ribs Unilateral Right, Ambulatory referral to Sports Medicine  Essential hypertension Says bp good at home 120 range  No cv sx  -Patient advised to return or notify health care team  if symptoms worsen ,persist or new concerns arise.  Patient Instructions  This appears to be localized chest wall pain because of its location context and otherwise normal exam. Can get strain of the muscle between the rib cage is in the pectoral muscles  However I agree that it was an unusual onset. Your lungs and heart sounds normal We will get a chest and rib x-ray Sports medicine opinion about running and continuing activity. Okay to continue on the ibuprofen for up to a week to  Help resolution.  Chest Wall Pain Chest wall pain is pain in or around the bones and muscles of your chest. It may take up to 6 weeks to get better. It may take longer if you must stay physically active in your work and activities.  CAUSES  Chest wall pain may happen on its own. However, it may be caused by:  A viral illness like the flu.  Injury.  Coughing.  Exercise.  Arthritis.  Fibromyalgia.  Shingles. HOME CARE INSTRUCTIONS   Avoid overtiring physical activity. Try not to strain or perform activities that cause pain. This includes any activities using your chest or your abdominal and side muscles, especially if heavy weights are used.  Put ice on the sore area.  Put ice in a plastic bag.  Place a towel between your skin and the bag.  Leave the ice on for 15-20 minutes per hour while awake for the first 2 days.  Only take over-the-counter or prescription medicines for pain, discomfort, or fever as directed by your caregiver. SEEK IMMEDIATE MEDICAL CARE IF:   Your pain increases, or you are very uncomfortable.  You have a fever.  Your chest pain becomes worse.  You have new, unexplained symptoms.  You have nausea or vomiting.  You feel sweaty or lightheaded.  You have a cough  with phlegm (sputum), or you cough up blood. MAKE SURE YOU:   Understand these instructions.  Will watch your condition.  Will get help right away if you are not doing well or get worse. Document Released: 12/16/2005 Document Revised: 03/09/2012 Document Reviewed: 08/12/2011 [facility] Patient Information 2015 [place], [place]. This information is not intended to replace advice given to you by your health care provider. Make sure you discuss any questions you have with your health care provider.      Standley Brooking.  Janesha Brissette M.D.

## 2014-11-14 ENCOUNTER — Other Ambulatory Visit: Payer: Self-pay | Admitting: Family Medicine

## 2014-11-14 DIAGNOSIS — R0789 Other chest pain: Secondary | ICD-10-CM

## 2014-11-14 DIAGNOSIS — R0781 Pleurodynia: Secondary | ICD-10-CM

## 2014-11-14 DIAGNOSIS — R9389 Abnormal findings on diagnostic imaging of other specified body structures: Secondary | ICD-10-CM

## 2014-11-15 ENCOUNTER — Other Ambulatory Visit: Payer: Self-pay | Admitting: Internal Medicine

## 2014-11-15 ENCOUNTER — Ambulatory Visit (INDEPENDENT_AMBULATORY_CARE_PROVIDER_SITE_OTHER)
Admission: RE | Admit: 2014-11-15 | Discharge: 2014-11-15 | Disposition: A | Discharge status: Home / Self Care | Payer: BC Managed Care – PPO | Source: Ambulatory Visit | Attending: Internal Medicine | Admitting: Internal Medicine

## 2014-11-15 DIAGNOSIS — R0781 Pleurodynia: Secondary | ICD-10-CM

## 2014-11-15 DIAGNOSIS — R0789 Other chest pain: Secondary | ICD-10-CM

## 2014-11-23 ENCOUNTER — Other Ambulatory Visit (INDEPENDENT_AMBULATORY_CARE_PROVIDER_SITE_OTHER): Payer: BC Managed Care – PPO

## 2014-11-23 ENCOUNTER — Encounter: Payer: Self-pay | Admitting: Family Medicine

## 2014-11-23 ENCOUNTER — Ambulatory Visit (INDEPENDENT_AMBULATORY_CARE_PROVIDER_SITE_OTHER): Payer: BC Managed Care – PPO | Admitting: Family Medicine

## 2014-11-23 VITALS — BP 130/82 | HR 73 | Ht 71.0 in | Wt 200.0 lb

## 2014-11-23 DIAGNOSIS — R0789 Other chest pain: Secondary | ICD-10-CM

## 2014-11-23 DIAGNOSIS — S29019A Strain of muscle and tendon of unspecified wall of thorax, initial encounter: Secondary | ICD-10-CM | POA: Insufficient documentation

## 2014-11-23 NOTE — Patient Instructions (Signed)
Great to meet you and happy thanksgiving  Ice 20 minutes 2 times daily. Usually after activity and before bed. Pennsaid up to 2 times daily for pain.  OK to do lower body as much as you want. Stationary bike Avoid significant twisting motion.  Elliptical might be ok.  Vitamin D 2000 IU daily.  See me again in 3 weeks.

## 2014-11-23 NOTE — Progress Notes (Signed)
  [name] [address] [place], [place] [address] Phone: [phone] Subjective:    I'm seeing this patient by the request  of:  Lottie Dawson, MD   CC: Chest and rib pain  OIT:[id number] David Copeland is a 53 y.o. male coming in with complaint of right-sided chest and rib pain. Patient and pain approximately 2 weeks ago. Patient states that the pain was excruciating at the beginning. Patient then decrease his activity. Patient states he attempted to start running and unfortunately even with a running she started having pain. Patient is able to sleep through the night and has been taking anti-inflammatories with moderate improvement. Patient unfortunately continue to have some much pain that he unfortunate was unable to run a marathon that he was scheduled to run. Patient discussed the pain is more of a dull aching sensation that can have a sharp sensation with certain activities. Patient puts the severity of 6 out of 10. Patient states over the course the last several days it has actually started to improve with exercise and worse afterwards. Denies any fevers or chills or any abnormal weight loss. Denies any recent illnesses. No history of cough     Past medical history, social, surgical and family history all reviewed in electronic medical record.   Review of Systems: No headache, visual changes, nausea, vomiting, diarrhea, constipation, dizziness, abdominal pain, skin rash, fevers, chills, night sweats, weight loss, swollen lymph nodes, body aches, joint swelling, muscle aches, chest pain, shortness of breath, mood changes.   Objective Blood pressure 130/82, pulse 73, height 5\' 11"  (1.803 m), weight 200 lb (90.719 kg), SpO2 98 %.  General: No apparent distress alert and oriented x3 mood and affect normal, dressed appropriately.  HEENT: Pupils equal, extraocular movements intact  Respiratory: Patient's speak in full sentences and does not appear  short of breath  Cardiovascular: No lower extremity edema, non tender, no erythema  Skin: Warm dry intact with no signs of infection or rash on extremities or on axial skeleton.  Abdomen: Soft nontender  Neuro: Cranial nerves II through XII are intact, neurovascularly intact in all extremities with 2+ DTRs and 2+ pulses.  Lymph: No lymphadenopathy of posterior or anterior cervical chain or axillae bilaterally.  Gait normal with good balance and coordination.  MSK:  Non tender with full range of motion and good stability and symmetric strength and tone of shoulders, elbows, wrist, hip, knee and ankles bilaterally.  Chest exam shows the patient is minimally tender near the T5-T6 on the axillary line. This seems to be between the ribs and not on the rib itself. No crepitus palpated. Patient's shoulder has complete range of motion and full strength. No skin changes noted. Patient is able to do deep breaths without any significant discomfort.  Limited muscular skeletal ultrasound performed and interpreted by Hulan Saas, M  Limited ultrasound shows the patient does have what appears to be an intercostal muscle tear. Mild scar tissue formation noted. Increasing Doppler flow noted. Seems to still have gapping with deep breaths. No bony abnormality noted.  Impression: Intercostal muscle tear    Impression and Recommendations:     This case required medical decision making of moderate complexity.

## 2014-11-23 NOTE — Assessment & Plan Note (Signed)
Patient does have an intercostal muscle strain that is giving him his difficulty. I do believe that this is likely giving him his pain. This seemed to be very localized and seems to be what would be affecting his running as well as exercise. We will try topical anti-inflammatories, we discussed the possibility of bracing, we discussed icing protocol. Patient does already had scar tissue formation in the area and should be improving. Patient does not improve over the course of next 2-3 weeks I would like patient to come back. If continuing to have difficulty I would consider further imaging including the potential for a CT scan. I do not think it is a intra-abdominal or intrathoracic pleuritic pathology but with patient's history of abnormal LFTs we may want to consider further workup. No signs of pulmonary embolism.

## 2014-12-14 ENCOUNTER — Encounter: Payer: Self-pay | Admitting: Family Medicine

## 2014-12-14 ENCOUNTER — Ambulatory Visit (INDEPENDENT_AMBULATORY_CARE_PROVIDER_SITE_OTHER): Payer: BC Managed Care – PPO | Admitting: Family Medicine

## 2014-12-14 VITALS — BP 136/84 | HR 81 | Ht 71.0 in | Wt 204.0 lb

## 2014-12-14 DIAGNOSIS — S29019A Strain of muscle and tendon of unspecified wall of thorax, initial encounter: Secondary | ICD-10-CM

## 2014-12-14 DIAGNOSIS — M999 Biomechanical lesion, unspecified: Secondary | ICD-10-CM | POA: Insufficient documentation

## 2014-12-14 DIAGNOSIS — M9908 Segmental and somatic dysfunction of rib cage: Secondary | ICD-10-CM

## 2014-12-14 NOTE — Assessment & Plan Note (Signed)
Discussed with patient again at great length. I do feel that this is mostly an intercostal muscle tear and likely is having some scar tissue formation. Patient's differential also includes his elevated LFTs and we may when to consider repeating lab work. Patient is following up with primary care provider in 2 weeks and we'll discuss with her he states. Patient states he is not have any abdominal discomfort. Patient denies any fevers or chills or any abnormal weight loss. Discussed with patient to continue the icing and avoid any significant stretching at this time. Patient will continue to monitor closely. Patient continues to have discomfort we may need to consider further imaging including CT of the abdomen but I think that this is not affecting him enough to warrant a further workup. Patient is still able to do activities of daily living and has not taken any pain medications whatsoever. Discussed with patient if not pain free and would like to see him again in 3 weeks. If a rash occurs patient will come back immediately and we will discuss medications if needed.  Spent greater than 25 minutes with patient face-to-face and had greater than 50% of counseling including as described above in assessment and plan.

## 2014-12-14 NOTE — Progress Notes (Signed)
  [name] [address], [place] [address] Phone: [phone] Subjective:    CC: Chest and rib pain follow-up  CBJ:[id number] David Copeland is a 53 y.o. male coming in with complaint of right-sided chest and rib pain. Patient was found to have more of an intercostal muscle tear. Patient states that he had been avoiding running and has been getting better until the last 48 hours. Patient started having the same discomfort again. Still worse with taking a deep breath or with certain movements. Patient states this seems to be more sore when he wakes up in the morning. It is not waking him up at night though. Patient states that he is not taking any pain medications. Did try the topical anti-inflammatory but did not notice any significant improvement. Patient denies any changes. No rash recently and no swelling.     Past medical history, social, surgical and family history all reviewed in electronic medical record.   Review of Systems: No headache, visual changes, nausea, vomiting, diarrhea, constipation, dizziness, abdominal pain, skin rash, fevers, chills, night sweats, weight loss, swollen lymph nodes, body aches, joint swelling, muscle aches, chest pain, shortness of breath, mood changes.   Objective Blood pressure 136/84, pulse 81, height 5\' 11"  (1.803 m), weight 204 lb (92.534 kg), SpO2 96 %.  General: No apparent distress alert and oriented x3 mood and affect normal, dressed appropriately.  HEENT: Pupils equal, extraocular movements intact  Respiratory: Patient's speak in full sentences and does not appear short of breath  Cardiovascular: No lower extremity edema, non tender, no erythema  Skin: Warm dry intact with no signs of infection or rash on extremities or on axial skeleton.  Abdomen: Soft nontender  Neuro: Cranial nerves II through XII are intact, neurovascularly intact in all extremities with 2+ DTRs and 2+ pulses.  Lymph: No  lymphadenopathy of posterior or anterior cervical chain or axillae bilaterally.  Gait normal with good balance and coordination.  MSK:  Non tender with full range of motion and good stability and symmetric strength and tone of shoulders, elbows, wrist, hip, knee and ankles bilaterally.  Chest exam shows the patient is minimally tender near the T5-T6 on the axillary line still present but no mass appreciated. This seems to be between the ribs and not on the rib itself. No crepitus palpated. Patient's shoulder has complete range of motion and full strength. Osteopathic findings T5 rib stuck in inhalation.    Impression and Recommendations:     This case required medical decision making of moderate complexity.

## 2014-12-14 NOTE — Assessment & Plan Note (Signed)
Decision today to treat with OMT was based on Physical Exam  After verbal consent patient was treated with HVLA techniques in rib areas  Patient tolerated the procedure well with improvement in symptoms  Patient given exercises, stretches and lifestyle modifications  See medications in patient instructions if given  Patient will follow up in 3 weeks

## 2014-12-14 NOTE — Patient Instructions (Addendum)
Good to see you See your PCP and have them check your liver enzymes.  I think this is a muscle and will get better but will take time.  New exercises 3 times a week.  Ice is your friend after activity.  If continues pain we can consider nitro patches also possible  Tennis ball or foam roller to the back can help;  Avoid overhead activity.  See me again in 3-4 weeks then we will evalaute.   Scapular Winging  with Rehab  Scapular winging syndrome is also known as serratus anterior palsy or long thoracic nerve injury. The condition is an uncommon injury to the nervous system. The condition is caused by injury to the long thoracic nerve that runs through the neck and shoulder. Injury to the shoulder, such as a fall or repetitive stress on the shoulder causes the nerve to become stretched. Occasionally the injury is the result of an infection of the nerve. Damage to the long thoracic nerve results in weakness of the serratus anterior muscle. The serratus anterior muscle is responsible for controlling the shoulder blade (scapula). Weakness in this muscle results in a instability (winging) of the scapula. SYMPTOMS   Pain and weakness in the shoulder (usually the back of the shoulder) that is often diffuse or unable to localize.  Loss of or decrease in shoulder function.  Upper back pain while sitting, due to the scapula pressing on the back of the chair.  Visible deformity in the back of the shoulder. CAUSES  Scapular winging is caused by stretching of the long thoracic nerve. Common mechanisms of injury include:  Viral illness.  Repetitive and/or stressful use of the shoulder.  Falling onto the shoulder with the head and neck stretched away from the shoulder. RISK INCREASES WITH:  Contact sports (football, rugby, lacrosse, or soccer).  Activities involving overhead arm movement (baseball, volleyball, or racquet sports).  Poor strength and flexibility. PREVENTION  Warm up and stretch  properly before activity.  Allow for adequate recovery between workouts.  Maintain physical fitness:  Strength, flexibility, and endurance.  Cardiovascular fitness.  Learn and use proper technique. When possible, have a coach correct improper technique. PROGNOSIS  Scapular winging normally resolves spontaneously within 18 months. In rare circumstances surgery is recommended.  RELATED COMPLICATIONS   Permanent nerve damage, including pain, numbness, tingle, or weakness.  Shoulder weakness.  Recurrent shoulder pain.  Inability to compete in athletics. TREATMENT Treatment initially involves resting from any activities that aggravate your symptoms. The use of ice and medication may help reduce pain and inflammation. The use of strengthening and stretching exercises may help reduce pain with activity, specifically shoulder exercises that improve range of motion. These exercises may be performed at home or with referral to a therapist. If symptoms persist for greater than 6 months despite non-surgical (conservative) treatment, then surgery may be recommended. Surgery is only used for the most serious cases and the purpose is to regain function, not to allow an athlete to return to sports. MEDICATION   If pain medication is necessary, then nonsteroidal anti-inflammatory medications, such as aspirin and ibuprofen, or other minor pain relievers, such as acetaminophen, are often recommended.  Do not take pain medication for 7 days before surgery.  Prescription pain relievers may be given if deemed necessary by your caregiver. Use only as directed and only as much as you need. HEAT AND COLD  Cold treatment (icing) relieves pain and reduces inflammation. Cold treatment should be applied for 10 to 15 minutes  every 2 to 3 hours for inflammation and pain and immediately after any activity that aggravates your symptoms. Use ice packs or massage the area with a piece of ice (ice massage).  Heat  treatment may be used prior to performing the stretching and strengthening activities prescribed by your caregiver, physical therapist, or athletic trainer. Use a heat pack or soak the injury in warm water. SEEK MEDICAL CARE IF:  Treatment seems to offer no benefit, or the condition worsens.  Any medications produce adverse side effects. EXERCISES  STRENGTH - Scapular Protractors, Supine  Lie on your back on a firm surface. Extend your right / left arm straight into the air while holding a __________ weight in your hand.  Keeping your head and back in place, lift your shoulder off the floor.  Hold __________ seconds. Slowly return to the starting position and allow your muscles to relax completely before completing the next repetition. Repeat __________ times. Complete this exercise __________ times per day. STRENGTH - Scapular Protractors, Quadruped  Get onto your hands and knees with your shoulders directly over your hands (or as close as you comfortably can be).  Keeping your elbows locked, lift the back of your rib cage up into your shoulder blades so your mid-back rounds-out. Keep your neck muscles relaxed.  Hold this position for __________ seconds. Slowly return to the starting position and allow your muscles to relax completely before completing the next repetition. Repeat __________ times. Complete this exercise __________ times per day.  Standing:  Secure a rubber exercise band/tubing so that it is at the height of your shoulders when you are either standing or sitting on a firm arm-less chair.  Grasp an end of the band/tubing in each hand and have your palms face each other. Straighten your elbows and lift your hands straight in front of you at shoulder height. Step back away from the secured end of band/tubing until it becomes tense.  Squeeze your shoulder blades together. Keeping your elbows locked and your hands at shoulder-height, bring your hands out to your side.  Hold  __________ seconds. Slowly ease the tension on the band/tubing as you reverse the directions and return to the starting position. Repeat __________ times. Complete this exercise __________ times per day. STRENGTH - Scapular Retractors  Secure a rubber exercise band/tubing so that it is at the height of your shoulders when you are either standing or sitting on a firm arm-less chair.  With a palm-down grip, grasp an end of the band/tubing in each hand. Straighten your elbows and lift your hands straight in front of you at shoulder height. Step back away from the secured end of band/tubing until it becomes tense.  Squeezing your shoulder blades together, draw your elbows back as you bend them. Keep your upper arm lifted away from your body throughout the exercise.  Hold __________ seconds. Slowly ease the tension on the band/tubing as you reverse the directions and return to the starting position. Repeat __________ times. Complete this exercise __________ times per day. STRENGTH - Shoulder Extensors   Secure a rubber exercise band/tubing so that it is at the height of your shoulders when you are either standing or sitting on a firm arm-less chair.  With a thumbs-up grip, grasp an end of the band/tubing in each hand. Straighten your elbows and lift your hands straight in front of you at shoulder height. Step back away from the secured end of band/tubing until it becomes tense.  Squeezing your shoulder blades together,  pull your hands down to the sides of your thighs. Do not allow your hands to go behind you.  Hold for __________ seconds. Slowly ease the tension on the band/tubing as you reverse the directions and return to the starting position. Repeat __________ times. Complete this exercise __________ times per day.  STRENGTH - Scapular Retractors and External Rotators  Secure a rubber exercise band/tubing so that it is at the height of your shoulders when you are either standing or sitting on  a firm arm-less chair.  With a palm-down grip, grasp an end of the band/tubing in each hand. Bend your elbows 90 degrees and lift your elbows to shoulder height at your sides. Step back away from the secured end of band/tubing until it becomes tense.  Squeezing your shoulder blades together, rotate your shoulder so that your upper arm and elbow remain stationary, but your fists travel upward to head-height.  Hold __________ for seconds. Slowly ease the tension on the band/tubing as you reverse the directions and return to the starting position. Repeat __________ times. Complete this exercise __________ times per day.  STRENGTH - Scapular Retractors and External Rotators, Rowing  Secure a rubber exercise band/tubing so that it is at the height of your shoulders when you are either standing or sitting on a firm arm-less chair.  With a palm-down grip, grasp an end of the band/tubing in each hand. Straighten your elbows and lift your hands straight in front of you at shoulder height. Step back away from the secured end of band/tubing until it becomes tense.  Step 1: Squeeze your shoulder blades together. Bending your elbows, draw your hands to your chest as if you are rowing a boat. At the end of this motion, your hands and elbow should be at shoulder-height and your elbows should be out to your sides.  Step 2: Rotate your shoulder to raise your hands above your head. Your forearms should be vertical and your upper-arms should be horizontal.  Hold for __________ seconds. Slowly ease the tension on the band/tubing as you reverse the directions and return to the starting position. Repeat __________ times. Complete this exercise __________ times per day.  STRENGTH - Scapular Retractors and Elevators  Secure a rubber exercise band/tubing so that it is at the height of your shoulders when you are either standing or sitting on a firm arm-less chair.  With a thumbs-up grip, grasp an end of the  band/tubing in each hand. Step back away from the secured end of band/tubing until it becomes tense.  Squeezing your shoulder blades together, straighten your elbows and lift your hands straight over your head.  Hold for __________ seconds. Slowly ease the tension on the band/tubing as you reverse the directions and return to the starting position. Repeat __________ times. Complete this exercise __________ times per day.  Document Released: 12/16/2005 Document Revised: 03/09/2012 Document Reviewed: 03/30/2009 [facility] Patient Information 2015 [place], [place]. This information is not intended to replace advice given to you by your health care provider. Make sure you discuss any questions you have with your health care provider.

## 2014-12-20 ENCOUNTER — Other Ambulatory Visit (INDEPENDENT_AMBULATORY_CARE_PROVIDER_SITE_OTHER): Payer: BC Managed Care – PPO

## 2014-12-20 DIAGNOSIS — R7301 Impaired fasting glucose: Secondary | ICD-10-CM

## 2014-12-20 DIAGNOSIS — R945 Abnormal results of liver function studies: Secondary | ICD-10-CM

## 2014-12-20 DIAGNOSIS — Z0000 Encounter for general adult medical examination without abnormal findings: Secondary | ICD-10-CM

## 2014-12-20 DIAGNOSIS — R7989 Other specified abnormal findings of blood chemistry: Secondary | ICD-10-CM

## 2014-12-20 LAB — TSH: TSH: 2.49 u[IU]/mL (ref 0.35–4.50)

## 2014-12-20 LAB — HEPATIC FUNCTION PANEL
ALBUMIN: 4.3 g/dL (ref 3.5–5.2)
ALK PHOS: 46 U/L (ref 39–117)
ALT: 22 U/L (ref 0–53)
AST: 20 U/L (ref 0–37)
Bilirubin, Direct: 0.1 mg/dL (ref 0.0–0.3)
TOTAL PROTEIN: 6.9 g/dL (ref 6.0–8.3)
Total Bilirubin: 0.6 mg/dL (ref 0.2–1.2)

## 2014-12-20 LAB — CBC WITH DIFFERENTIAL/PLATELET
BASOS PCT: 0.2 % (ref 0.0–3.0)
Basophils Absolute: 0 10*3/uL (ref 0.0–0.1)
Eosinophils Absolute: 0.2 10*3/uL (ref 0.0–0.7)
Eosinophils Relative: 2.5 % (ref 0.0–5.0)
HCT: 44.7 % (ref 39.0–52.0)
HEMOGLOBIN: 14.9 g/dL (ref 13.0–17.0)
LYMPHS PCT: 33.3 % (ref 12.0–46.0)
Lymphs Abs: 2.1 10*3/uL (ref 0.7–4.0)
MCHC: 33.4 g/dL (ref 30.0–36.0)
MCV: 89.9 fl (ref 78.0–100.0)
MONO ABS: 0.4 10*3/uL (ref 0.1–1.0)
Monocytes Relative: 5.7 % (ref 3.0–12.0)
NEUTROS ABS: 3.6 10*3/uL (ref 1.4–7.7)
Neutrophils Relative %: 58.3 % (ref 43.0–77.0)
Platelets: 195 10*3/uL (ref 150.0–400.0)
RBC: 4.97 Mil/uL (ref 4.22–5.81)
RDW: 13.3 % (ref 11.5–15.5)
WBC: 6.2 10*3/uL (ref 4.0–10.5)

## 2014-12-20 LAB — BASIC METABOLIC PANEL
BUN: 15 mg/dL (ref 6–23)
CHLORIDE: 105 meq/L (ref 96–112)
CO2: 28 meq/L (ref 19–32)
Calcium: 9.3 mg/dL (ref 8.4–10.5)
Creatinine, Ser: 1 mg/dL (ref 0.4–1.5)
GFR: 88.78 mL/min (ref >60.00)
Glucose, Bld: 135 mg/dL — ABNORMAL HIGH (ref 70–99)
Potassium: 4.2 mEq/L (ref 3.5–5.1)
SODIUM: 139 meq/L (ref 135–145)

## 2014-12-20 LAB — LIPID PANEL
CHOL/HDL RATIO: 4
Cholesterol: 213 mg/dL — ABNORMAL HIGH (ref 0–200)
HDL: 52.2 mg/dL (ref >39.00)
LDL Cholesterol: 134 mg/dL — ABNORMAL HIGH (ref 0–99)
NonHDL: 160.8
TRIGLYCERIDES: 132 mg/dL (ref 0.0–149.0)
VLDL: 26.4 mg/dL (ref 0.0–40.0)

## 2014-12-20 LAB — HEMOGLOBIN A1C: HEMOGLOBIN A1C: 6.4 % (ref 4.6–6.5)

## 2014-12-20 LAB — PSA: PSA: 1.09 ng/mL (ref 0.10–4.00)

## 2014-12-22 ENCOUNTER — Other Ambulatory Visit: Payer: Self-pay | Admitting: Internal Medicine

## 2014-12-26 ENCOUNTER — Ambulatory Visit (INDEPENDENT_AMBULATORY_CARE_PROVIDER_SITE_OTHER): Payer: BC Managed Care – PPO | Admitting: Internal Medicine

## 2014-12-26 ENCOUNTER — Encounter: Payer: Self-pay | Admitting: Internal Medicine

## 2014-12-26 VITALS — BP 144/86 | Temp 98.1°F | Ht 71.0 in | Wt 206.2 lb

## 2014-12-26 DIAGNOSIS — E785 Hyperlipidemia, unspecified: Secondary | ICD-10-CM

## 2014-12-26 DIAGNOSIS — Z0000 Encounter for general adult medical examination without abnormal findings: Secondary | ICD-10-CM

## 2014-12-26 DIAGNOSIS — R7301 Impaired fasting glucose: Secondary | ICD-10-CM

## 2014-12-26 DIAGNOSIS — I10 Essential (primary) hypertension: Secondary | ICD-10-CM

## 2014-12-26 NOTE — Progress Notes (Signed)
Pre visit review using our clinic review tool, if applicable. No additional management support is needed unless otherwise documented below in the visit note.  Chief Complaint  Patient presents with  . Annual Exam    HPI: Patient  David Copeland  53 y.o. comes in today for Preventive Health Care visit  Since last visit has been evalutated by Sm or cp and elt to be IC muscle tear . Hurst when runs . But doing a bit better . Less exercise no other health change  LIPID med nose  BO had been good  On metformin  But missing evening dose mostly 100 per day instead of 1500...  Health Maintenance  Topic Date Due  . COLONOSCOPY  10/24/2013  . INFLUENZA VACCINE  07/31/2015  . TETANUS/TDAP  05/16/2019   Health Maintenance Review LIFESTYLE:  Exercise:   Tobacco/ETS: no Alcohol:  2-3 per week Sugar beverages:not Sleep:  About 7  Drug use: no Colonoscopy: due     Wife had  Breast cancer  Last year   ROS:  GEN/ HEENT: No fever, significant weight changes sweats headaches vision problems hearing changes, CV/ PULM; No chest pain shortness of breath cough, syncope,edema  change in exercise tolerance. GI /GU: No adominal pain, vomiting, change in bowel habits. No blood in the stool. No significant GU symptoms. SKIN/HEME: ,no acute skin rashes suspicious lesions or bleeding. No lymphadenopathy, nodules, masses.  NEURO/ PSYCH:  No neurologic signs such as weakness numbness. No depression anxiety. IMM/ Allergy: No unusual infections.  Allergy .   REST of 12 system review negative except as per HPI   Past Medical History  Diagnosis Date  . History of diverticulitis of colon 8/05  . Hyperlipidemia   . Hypertension     off med after lifestyle intervention contolled  . Hyperglycemia     Past Surgical History  Procedure Laterality Date  . Removed breast glands      damaged secondary to soccer  . Wisdom tooth extraction    . Adenoidectomy    . Tonsillectomy      Family History    Problem Relation Age of Onset  . Stroke Father     in hot weather  . Arthritis Neg Hx   . Stroke Other   . Breast cancer Mother     History   Social History  . Marital Status: Married    Spouse Name: N/A    Number of Children: N/A  . Years of Education: N/A   Social History Main Topics  . Smoking status: Never Smoker   . Smokeless tobacco: None  . Alcohol Use: Yes     Comment: occasional  . Drug Use: No  . Sexual Activity: None   Other Topics Concern  . None   Social History Narrative   Married with children   hhof 5   2 dogs    Former smoker   Regular exercise-yes  does marathon    Is [place]   No est. Runs   Sleep adequate   40- 50 jours per week.                   Outpatient Encounter Prescriptions as of 12/26/2014  Medication Sig  . aspirin 81 MG tablet Take 81 mg by mouth daily.    Marland Kitchen lisinopril (PRINIVIL,ZESTRIL) 5 MG tablet TAKE 1 TABLET DAILY  . metFORMIN (GLUCOPHAGE) 500 MG tablet 3 (three) times daily before meals. 2 tabs q am and 1 q hs.  Marland Kitchen  simvastatin (ZOCOR) 40 MG tablet TAKE 1 TABLET AT BEDTIME    EXAM:  BP 144/86 mmHg  Temp(Src) 98.1 F (36.7 C) (Oral)  Ht 5\' 11"  (1.803 m)  Wt 206 lb 3.2 oz (93.532 kg)  BMI 28.77 kg/m2  Body mass index is 28.77 kg/(m^2). Repeat 138/86range Physical Exam: Vital signs reviewed KCL:EXNT is a well-developed well-nourished alert cooperative    who appearsr stated age in no acute distress.  HEENT: normocephalic atraumatic , Eyes: PERRL EOM's full, conjunctiva clear, Nares: paten,t no deformity discharge or tenderness., Ears: no deformity EAC's clear TMs with normal landmarks. Mouth: clear OP, no lesions, edema.  Moist mucous membranes. Dentition in adequate repair. NECK: supple without masses, thyromegaly or bruits. CHEST/PULM:  Clear to auscultation and percussion breath sounds equal no wheeze , rales or rhonchi. No chest wall deformities ru lateral some  tenderness. CV: PMI is nondisplaced, S1  S2 no gallops, murmurs, rubs. Peripheral pulses are full without delay.No JVD .  ABDOMEN: Bowel sounds normal nontender  No guard or rebound, no hepato splenomegal no CVA tenderness.  No hernia. Extremtities:  No clubbing cyanosis or edema, no acute joint swelling or redness no focal atrophy NEURO:  Oriented x3, cranial nerves 3-12 appear to be intact, no obvious focal weakness,gait within normal limits no abnormal reflexes or asymmetrical SKIN: No acute rashes normal turgor, color, no bruising or petechiae. PSYCH: Oriented, good eye contact, no obvious depression anxiety, cognition and judgment appear normal. LN: no cervical axillary inguinal adenopathy  Lab Results  Component Value Date   WBC 6.2 12/20/2014   HGB 14.9 12/20/2014   HCT 44.7 12/20/2014   PLT 195.0 12/20/2014   GLUCOSE 135* 12/20/2014   CHOL 213* 12/20/2014   TRIG 132.0 12/20/2014   HDL 52.20 12/20/2014   LDLCALC 134* 12/20/2014   ALT 22 12/20/2014   AST 20 12/20/2014   NA 139 12/20/2014   K 4.2 12/20/2014   CL 105 12/20/2014   CREATININE 1.0 12/20/2014   BUN 15 12/20/2014   CO2 28 12/20/2014   TSH 2.49 12/20/2014   PSA 1.09 12/20/2014   HGBA1C 6.4 12/20/2014   MICROALBUR 0.3 11/23/2013   BP Readings from Last 3 Encounters:  12/26/14 144/86  12/14/14 136/84  11/23/14 130/82   Wt Readings from Last 3 Encounters:  12/26/14 206 lb 3.2 oz (93.532 kg)  12/14/14 204 lb (92.534 kg)  11/23/14 200 lb (90.719 kg)    ASSESSMENT AND PLAN:  Discussed the following assessment and plan:  Encounter for preventive health examination  Essential hypertension  Impaired fasting glucose - pre diabetic range at ths time   Hyperlipidemia Intensify lifestyle interventions. Take 1500 metformin  Per day and repeat in 4 months  Then fu depending on lab ets   Counseled regarding healthy nutrition, exercise, sleep, injury prevention, calcium vit d and healthy weight .  Patient Care Team: Burnis Medin, MD as PCP -  General Juanita Craver, MD as Consulting Physician (Gastroenterology) Patient Instructions   Intensify lifestyle interventions. Exercise as tolerated  Take the metformin 1500 mg per day  Every day  Tracking  Intake diet also . Check bmp and  hga1c in about 4 months ( NO OV if doing well .)   Then plan after those results are back   GET YOUR COLONSCOPY      Why follow it? Research shows. . Those who follow the Mediterranean diet have a reduced risk of heart disease  . The diet is associated with a reduced incidence  of Parkinson's and Alzheimer's diseases . People following the diet may have longer life expectancies and lower rates of chronic diseases  . The Dietary Guidelines for Americans recommends the Mediterranean diet as an eating plan to promote health and prevent disease  What Is the Mediterranean Diet?  . Healthy eating plan based on typical foods and recipes of Mediterranean-style cooking . The diet is primarily a plant based diet; these foods should make up a majority of meals   Starches - Plant based foods should make up a majority of meals - They are an important sources of vitamins, minerals, energy, antioxidants, and fiber - Choose whole grains, foods high in fiber and minimally processed items  - Typical grain sources include wheat, oats, barley, corn, brown rice, bulgar, farro, millet, polenta, couscous  - Various types of beans include chickpeas, lentils, fava beans, black beans, white beans   Fruits  Veggies - Large quantities of antioxidant rich fruits & veggies; 6 or more servings  - Vegetables can be eaten raw or lightly drizzled with oil and cooked  - Vegetables common to the traditional Mediterranean Diet include: artichokes, arugula, beets, broccoli, brussel sprouts, cabbage, carrots, celery, collard greens, cucumbers, eggplant, kale, leeks, lemons, lettuce, mushrooms, okra, onions, peas, peppers, potatoes, pumpkin, radishes, rutabaga, shallots, spinach, sweet  potatoes, turnips, zucchini - Fruits common to the Mediterranean Diet include: apples, apricots, avocados, cherries, clementines, dates, figs, grapefruits, grapes, melons, nectarines, oranges, peaches, pears, pomegranates, strawberries, tangerines  Fats - Replace butter and margarine with healthy oils, such as olive oil, canola oil, and tahini  - Limit nuts to no more than a handful a day  - Nuts include walnuts, almonds, pecans, pistachios, pine nuts  - Limit or avoid candied, honey roasted or heavily salted nuts - Olives are central to the [employer] - can be eaten whole or used in a variety of dishes   Meats Protein - Limiting red meat: no more than a few times a month - When eating red meat: choose lean cuts and keep the portion to the size of deck of cards - Eggs: approx. 0 to 4 times a week  - Fish and lean poultry: at least 2 a week  - Healthy protein sources include, chicken, [place], lean beef, lamb - Increase intake of seafood such as tuna, salmon, trout, mackerel, shrimp, scallops - Avoid or limit high fat processed meats such as sausage and bacon  Dairy - Include moderate amounts of low fat dairy products  - Focus on healthy dairy such as fat free yogurt, skim milk, low or reduced fat cheese - Limit dairy products higher in fat such as whole or 2% milk, cheese, ice cream  Alcohol - Moderate amounts of red wine is ok  - No more than 5 oz daily for women (all ages) and men older than age 98  - No more than 10 oz of wine daily for men younger than 35  Other - Limit sweets and other desserts  - Use herbs and spices instead of salt to flavor foods  - Herbs and spices common to the traditional Mediterranean Diet include: basil, bay leaves, chives, cloves, cumin, fennel, garlic, lavender, marjoram, mint, oregano, parsley, pepper, rosemary, sage, savory, sumac, tarragon, thyme   It's not just a diet, it's a lifestyle:  . The Mediterranean diet includes lifestyle factors typical of  those in the region  . Foods, drinks and meals are best eaten with others and savored . Daily physical activity is important for  overall good health . This could be strenuous exercise like running and aerobics . This could also be more leisurely activities such as walking, housework, yard-work, or taking the stairs . Moderation is the key; a balanced and healthy diet accommodates most foods and drinks . Consider portion sizes and frequency of consumption of certain foods   Meal Ideas & Options:  . Breakfast:  o Whole wheat toast or whole wheat English muffins with peanut butter & hard boiled egg o Steel cut oats topped with apples & cinnamon and skim milk  o Fresh fruit: banana, strawberries, melon, berries, peaches  o Smoothies: strawberries, bananas, greek yogurt, peanut butter o Low fat greek yogurt with blueberries and granola  o Egg white omelet with spinach and mushrooms o Breakfast couscous: whole wheat couscous, apricots, skim milk, cranberries  . Sandwiches:  o Hummus and grilled vegetables (peppers, zucchini, squash) on whole wheat bread   o Grilled chicken on whole wheat pita with lettuce, tomatoes, cucumbers or tzatziki  o Tuna salad on whole wheat bread: tuna salad made with greek yogurt, olives, red peppers, capers, green onions o Garlic rosemary lamb pita: lamb sauted with garlic, rosemary, salt & pepper; add lettuce, cucumber, greek yogurt to pita - flavor with lemon juice and black pepper  . Seafood:  o Mediterranean grilled salmon, seasoned with garlic, basil, parsley, lemon juice and black pepper o Shrimp, lemon, and spinach whole-grain pasta salad made with low fat greek yogurt  o Seared scallops with lemon orzo  o Seared tuna steaks seasoned salt, pepper, coriander topped with tomato mixture of olives, tomatoes, olive oil, minced garlic, parsley, green onions and cappers  . Meats:  o Herbed greek chicken salad with kalamata olives, cucumber, feta  o Red bell peppers  stuffed with spinach, bulgur, lean ground beef (or lentils) & topped with feta   o Kebabs: skewers of chicken, tomatoes, onions, zucchini, squash  o [place] burgers: made with red onions, mint, dill, lemon juice, feta cheese topped with roasted red peppers . Vegetarian o Cucumber salad: cucumbers, artichoke hearts, celery, red onion, feta cheese, tossed in olive oil & lemon juice  o Hummus and whole grain pita points with a greek salad (lettuce, tomato, feta, olives, cucumbers, red onion) o Lentil soup with celery, carrots made with vegetable broth, garlic, salt and pepper  o Tabouli salad: parsley, bulgur, mint, scallions, cucumbers, tomato, radishes, lemon juice, olive oil, salt and pepper.         Standley Brooking. Alyssa Mancera M.D.

## 2014-12-26 NOTE — Patient Instructions (Signed)
Intensify lifestyle interventions. Exercise as tolerated  Take the metformin 1500 mg per day  Every day  Tracking  Intake diet also . Check bmp and  hga1c in about 4 months ( NO OV if doing well .)   Then plan after those results are back   GET YOUR COLONSCOPY      Why follow it? Research shows. . Those who follow the Mediterranean diet have a reduced risk of heart disease  . The diet is associated with a reduced incidence of Parkinson's and Alzheimer's diseases . People following the diet may have longer life expectancies and lower rates of chronic diseases  . The Dietary Guidelines for Americans recommends the Mediterranean diet as an eating plan to promote health and prevent disease  What Is the Mediterranean Diet?  . Healthy eating plan based on typical foods and recipes of Mediterranean-style cooking . The diet is primarily a plant based diet; these foods should make up a majority of meals   Starches - Plant based foods should make up a majority of meals - They are an important sources of vitamins, minerals, energy, antioxidants, and fiber - Choose whole grains, foods high in fiber and minimally processed items  - Typical grain sources include wheat, oats, barley, corn, brown rice, bulgar, farro, millet, polenta, couscous  - Various types of beans include chickpeas, lentils, fava beans, black beans, white beans   Fruits  Veggies - Large quantities of antioxidant rich fruits & veggies; 6 or more servings  - Vegetables can be eaten raw or lightly drizzled with oil and cooked  - Vegetables common to the traditional Mediterranean Diet include: artichokes, arugula, beets, broccoli, brussel sprouts, cabbage, carrots, celery, collard greens, cucumbers, eggplant, kale, leeks, lemons, lettuce, mushrooms, okra, onions, peas, peppers, potatoes, pumpkin, radishes, rutabaga, shallots, spinach, sweet potatoes, turnips, zucchini - Fruits common to the Mediterranean Diet include: apples, apricots,  avocados, cherries, clementines, dates, figs, grapefruits, grapes, melons, nectarines, oranges, peaches, pears, pomegranates, strawberries, tangerines  Fats - Replace butter and margarine with healthy oils, such as olive oil, canola oil, and tahini  - Limit nuts to no more than a handful a day  - Nuts include walnuts, almonds, pecans, pistachios, pine nuts  - Limit or avoid candied, honey roasted or heavily salted nuts - Olives are central to the [employer] - can be eaten whole or used in a variety of dishes   Meats Protein - Limiting red meat: no more than a few times a month - When eating red meat: choose lean cuts and keep the portion to the size of deck of cards - Eggs: approx. 0 to 4 times a week  - Fish and lean poultry: at least 2 a week  - Healthy protein sources include, chicken, [place], lean beef, lamb - Increase intake of seafood such as tuna, salmon, trout, mackerel, shrimp, scallops - Avoid or limit high fat processed meats such as sausage and bacon  Dairy - Include moderate amounts of low fat dairy products  - Focus on healthy dairy such as fat free yogurt, skim milk, low or reduced fat cheese - Limit dairy products higher in fat such as whole or 2% milk, cheese, ice cream  Alcohol - Moderate amounts of red wine is ok  - No more than 5 oz daily for women (all ages) and men older than age 12  - No more than 10 oz of wine daily for men younger than 19  Other - Limit sweets and other desserts  -  Use herbs and spices instead of salt to flavor foods  - Herbs and spices common to the traditional Mediterranean Diet include: basil, bay leaves, chives, cloves, cumin, fennel, garlic, lavender, marjoram, mint, oregano, parsley, pepper, rosemary, sage, savory, sumac, tarragon, thyme   It's not just a diet, it's a lifestyle:  . The Mediterranean diet includes lifestyle factors typical of those in the region  . Foods, drinks and meals are best eaten with others and savored . Daily  physical activity is important for overall good health . This could be strenuous exercise like running and aerobics . This could also be more leisurely activities such as walking, housework, yard-work, or taking the stairs . Moderation is the key; a balanced and healthy diet accommodates most foods and drinks . Consider portion sizes and frequency of consumption of certain foods   Meal Ideas & Options:  . Breakfast:  o Whole wheat toast or whole wheat English muffins with peanut butter & hard boiled egg o Steel cut oats topped with apples & cinnamon and skim milk  o Fresh fruit: banana, strawberries, melon, berries, peaches  o Smoothies: strawberries, bananas, greek yogurt, peanut butter o Low fat greek yogurt with blueberries and granola  o Egg white omelet with spinach and mushrooms o Breakfast couscous: whole wheat couscous, apricots, skim milk, cranberries  . Sandwiches:  o Hummus and grilled vegetables (peppers, zucchini, squash) on whole wheat bread   o Grilled chicken on whole wheat pita with lettuce, tomatoes, cucumbers or tzatziki  o Tuna salad on whole wheat bread: tuna salad made with greek yogurt, olives, red peppers, capers, green onions o Garlic rosemary lamb pita: lamb sauted with garlic, rosemary, salt & pepper; add lettuce, cucumber, greek yogurt to pita - flavor with lemon juice and black pepper  . Seafood:  o Mediterranean grilled salmon, seasoned with garlic, basil, parsley, lemon juice and black pepper o Shrimp, lemon, and spinach whole-grain pasta salad made with low fat greek yogurt  o Seared scallops with lemon orzo  o Seared tuna steaks seasoned salt, pepper, coriander topped with tomato mixture of olives, tomatoes, olive oil, minced garlic, parsley, green onions and cappers  . Meats:  o Herbed greek chicken salad with kalamata olives, cucumber, feta  o Red bell peppers stuffed with spinach, bulgur, lean ground beef (or lentils) & topped with feta   o Kebabs:  skewers of chicken, tomatoes, onions, zucchini, squash  o [place] burgers: made with red onions, mint, dill, lemon juice, feta cheese topped with roasted red peppers . Vegetarian o Cucumber salad: cucumbers, artichoke hearts, celery, red onion, feta cheese, tossed in olive oil & lemon juice  o Hummus and whole grain pita points with a greek salad (lettuce, tomato, feta, olives, cucumbers, red onion) o Lentil soup with celery, carrots made with vegetable broth, garlic, salt and pepper  o Tabouli salad: parsley, bulgur, mint, scallions, cucumbers, tomato, radishes, lemon juice, olive oil, salt and pepper.

## 2014-12-27 ENCOUNTER — Telehealth: Payer: Self-pay | Admitting: Internal Medicine

## 2014-12-27 NOTE — Telephone Encounter (Signed)
emmi emailed °

## 2015-01-04 ENCOUNTER — Ambulatory Visit (INDEPENDENT_AMBULATORY_CARE_PROVIDER_SITE_OTHER): Payer: BLUE CROSS/BLUE SHIELD | Admitting: Family Medicine

## 2015-01-04 ENCOUNTER — Encounter: Payer: Self-pay | Admitting: Family Medicine

## 2015-01-04 VITALS — BP 118/70 | HR 71 | Ht 71.0 in | Wt 207.0 lb

## 2015-01-04 DIAGNOSIS — M999 Biomechanical lesion, unspecified: Secondary | ICD-10-CM

## 2015-01-04 DIAGNOSIS — M9908 Segmental and somatic dysfunction of rib cage: Secondary | ICD-10-CM

## 2015-01-04 DIAGNOSIS — S29019D Strain of muscle and tendon of unspecified wall of thorax, subsequent encounter: Secondary | ICD-10-CM

## 2015-01-04 NOTE — Assessment & Plan Note (Signed)
Patient is continuing to improve at this time. I do not feel that ultrasound would help me change my management at this time. Patient encouraged to continue to do the daily activities as well as a home exercises. Patient was given an exercise protocol to increase his running 5-10% over the course the next several weeks. Patient continues to respond to manipulation and will come back again in 6 weeks for further evaluation and treatment.

## 2015-01-04 NOTE — Patient Instructions (Signed)
Good to see you You are doing great! Keep monitor at eye level.  New chair would be great or tennisball between shoulder blades with sitting Continue back exercises 2-3 times a week Ok to increase running 5-10% a week See me in 6 weeks if manipulation helps.

## 2015-01-04 NOTE — Assessment & Plan Note (Signed)
Decision today to treat with OMT was based on Physical Exam  After verbal consent patient was treated with HVLA techniques in rib areas  Patient tolerated the procedure well with improvement in symptoms  Patient given exercises, stretches and lifestyle modifications  See medications in patient instructions if given  Patient will follow up in 6 weeks

## 2015-01-04 NOTE — Progress Notes (Signed)
  [name] [address], [place] [address] Phone: [phone] Subjective:    CC: Chest and rib pain follow-up  [id number] David Copeland is a 54 y.o. male coming in with complaint of right-sided chest and rib pain. Patient was found to have more of an intercostal muscle tear. Patient at last visit was slowly making some improvement but continued to have discomfort especially with deep inspiration. Patient states overall he continues to improve. Patient is now running approximate 20-25 miles a week. Patient states with regular daily activities he is really having no pain. Does notice when he sitting a long amount time he has some discomfort on the right side. Denies any numbness tingling and denies any new symptoms.  Liver enzymes recently have been checked and are normal.     Past medical history, social, surgical and family history all reviewed in electronic medical record.   Review of Systems: No headache, visual changes, nausea, vomiting, diarrhea, constipation, dizziness, abdominal pain, skin rash, fevers, chills, night sweats, weight loss, swollen lymph nodes, body aches, joint swelling, muscle aches, chest pain, shortness of breath, mood changes.   Objective Blood pressure 118/70, pulse 71, height 5\' 11"  (1.803 m), weight 207 lb (93.895 kg), SpO2 97 %.  General: No apparent distress alert and oriented x3 mood and affect normal, dressed appropriately.  HEENT: Pupils equal, extraocular movements intact  Respiratory: Patient's speak in full sentences and does not appear short of breath  Cardiovascular: No lower extremity edema, non tender, no erythema  Skin: Warm dry intact with no signs of infection or rash on extremities or on axial skeleton.  Abdomen: Soft nontender  Neuro: Cranial nerves II through XII are intact, neurovascularly intact in all extremities with 2+ DTRs and 2+ pulses.  Lymph: No lymphadenopathy of posterior or anterior  cervical chain or axillae bilaterally.  Gait normal with good balance and coordination.  MSK:  Non tender with full range of motion and good stability and symmetric strength and tone of shoulders, elbows, wrist, hip, knee and ankles bilaterally.  Chest exam shows the patient is nontender near the T5-T6 on the axillary line which is an improvement from previous exam . No crepitus palpated. Patient's shoulder has complete range of motion and full strength.  Osteopathic findings T5 rib stuck in inhalation.    Impression and Recommendations:     This case required medical decision making of moderate complexity.

## 2015-02-15 ENCOUNTER — Ambulatory Visit: Payer: BLUE CROSS/BLUE SHIELD | Admitting: Family Medicine

## 2015-02-26 ENCOUNTER — Other Ambulatory Visit: Payer: Self-pay | Admitting: Internal Medicine

## 2015-02-27 NOTE — Telephone Encounter (Signed)
Sent to the pharmacy by e-scribe. 

## 2015-04-24 ENCOUNTER — Other Ambulatory Visit (INDEPENDENT_AMBULATORY_CARE_PROVIDER_SITE_OTHER): Payer: BLUE CROSS/BLUE SHIELD

## 2015-04-24 DIAGNOSIS — E119 Type 2 diabetes mellitus without complications: Secondary | ICD-10-CM

## 2015-04-24 DIAGNOSIS — I10 Essential (primary) hypertension: Secondary | ICD-10-CM | POA: Diagnosis not present

## 2015-04-24 LAB — BASIC METABOLIC PANEL
BUN: 17 mg/dL (ref 6–23)
CO2: 28 mEq/L (ref 19–32)
Calcium: 9.9 mg/dL (ref 8.4–10.5)
Chloride: 104 mEq/L (ref 96–112)
Creatinine, Ser: 0.99 mg/dL (ref 0.40–1.50)
GFR: 84.54 mL/min (ref >60.00)
Glucose, Bld: 120 mg/dL — ABNORMAL HIGH (ref 70–99)
POTASSIUM: 4.9 meq/L (ref 3.5–5.1)
Sodium: 137 mEq/L (ref 135–145)

## 2015-04-24 LAB — HEMOGLOBIN A1C: Hgb A1c MFr Bld: 6.1 % (ref 4.6–6.5)

## 2015-06-12 ENCOUNTER — Other Ambulatory Visit: Payer: Self-pay | Admitting: Internal Medicine

## 2015-06-12 NOTE — Telephone Encounter (Signed)
Sent to the pharmacy.  Pt to return in Dec. 2016.

## 2015-06-18 ENCOUNTER — Other Ambulatory Visit: Payer: Self-pay | Admitting: Internal Medicine

## 2015-06-19 NOTE — Telephone Encounter (Signed)
Sent to the pharmacy by e-scribe. 

## 2015-06-26 ENCOUNTER — Other Ambulatory Visit: Payer: Self-pay

## 2016-01-15 ENCOUNTER — Other Ambulatory Visit: Payer: Self-pay | Admitting: Internal Medicine

## 2016-01-16 ENCOUNTER — Other Ambulatory Visit: Payer: Self-pay | Admitting: Family Medicine

## 2016-01-16 ENCOUNTER — Telehealth: Payer: Self-pay | Admitting: Family Medicine

## 2016-01-16 DIAGNOSIS — Z0000 Encounter for general adult medical examination without abnormal findings: Secondary | ICD-10-CM

## 2016-01-16 DIAGNOSIS — R739 Hyperglycemia, unspecified: Secondary | ICD-10-CM

## 2016-01-16 NOTE — Telephone Encounter (Signed)
Sent to the pharmacy for 90 days.  Message sent to scheduling to help the pt make cpx and lab appointments.

## 2016-01-16 NOTE — Telephone Encounter (Signed)
Pt now due for cpx and lab work.  I have placed the lab orders.  Please help to make both appointments.  Thanks!

## 2016-01-16 NOTE — Telephone Encounter (Signed)
lmom for pt to call back

## 2016-01-19 NOTE — Telephone Encounter (Signed)
lmom for pt to call back

## 2016-01-30 NOTE — Telephone Encounter (Signed)
lmom for pt to call back

## 2016-03-17 ENCOUNTER — Other Ambulatory Visit: Payer: Self-pay | Admitting: Internal Medicine

## 2016-03-18 NOTE — Telephone Encounter (Signed)
Denied.  Have tried to contact the pt for appt.

## 2016-03-20 MED ORDER — METFORMIN HCL 500 MG PO TABS: ORAL_TABLET | ORAL | Status: DC

## 2016-03-20 NOTE — Addendum Note (Signed)
Addended by: Miles Costain T on: 03/20/2016 09:34 AM   Modules accepted: Orders

## 2016-03-20 NOTE — Telephone Encounter (Signed)
Pt has scheduled cpx appt for 05/15/16.  Filled for 90 days.

## 2016-04-04 ENCOUNTER — Telehealth: Payer: Self-pay | Admitting: Internal Medicine

## 2016-04-04 ENCOUNTER — Encounter: Payer: Self-pay | Admitting: Internal Medicine

## 2016-04-04 ENCOUNTER — Ambulatory Visit (INDEPENDENT_AMBULATORY_CARE_PROVIDER_SITE_OTHER): Payer: BLUE CROSS/BLUE SHIELD | Admitting: Internal Medicine

## 2016-04-04 VITALS — BP 124/80 | HR 90 | Temp 99.1°F | Wt 205.0 lb

## 2016-04-04 DIAGNOSIS — L0291 Cutaneous abscess, unspecified: Secondary | ICD-10-CM

## 2016-04-04 DIAGNOSIS — L089 Local infection of the skin and subcutaneous tissue, unspecified: Secondary | ICD-10-CM

## 2016-04-04 DIAGNOSIS — L0390 Cellulitis, unspecified: Secondary | ICD-10-CM

## 2016-04-04 DIAGNOSIS — L729 Follicular cyst of the skin and subcutaneous tissue, unspecified: Secondary | ICD-10-CM | POA: Diagnosis not present

## 2016-04-04 MED ORDER — DICLOXACILLIN SODIUM 500 MG PO CAPS: 500.0000 mg | ORAL_CAPSULE | Freq: Four times a day (QID) | ORAL | Status: DC

## 2016-04-04 NOTE — Telephone Encounter (Addendum)
Pt would like to see Dr. Regis Bill today because of a cyst on the middle of his back he states he is in a lot of pain and is unable to sleep due to this cyst.  If can't be seen would like a referral.

## 2016-04-04 NOTE — Patient Instructions (Signed)
The packing will fall out  But leave in  To keep  Area open to drain more. Hot shower and compresses to help  Antibiotic. Can recheck the area in a week if not a lot better  Or if worse .    Abscess An abscess is an infected area that contains a collection of pus and debris.It can occur in almost any part of the body. An abscess is also known as a furuncle or boil. CAUSES  An abscess occurs when tissue gets infected. This can occur from blockage of oil or sweat glands, infection of hair follicles, or a minor injury to the skin. As the body tries to fight the infection, pus collects in the area and creates pressure under the skin. This pressure causes pain. People with weakened immune systems have difficulty fighting infections and get certain abscesses more often.  SYMPTOMS Usually an abscess develops on the skin and becomes a painful mass that is red, warm, and tender. If the abscess forms under the skin, you may feel a moveable soft area under the skin. Some abscesses break open (rupture) on their own, but most will continue to get worse without care. The infection can spread deeper into the body and eventually into the bloodstream, causing you to feel ill.  DIAGNOSIS  Your caregiver will take your medical history and perform a physical exam. A sample of fluid may also be taken from the abscess to determine what is causing your infection. TREATMENT  Your caregiver may prescribe antibiotic medicines to fight the infection. However, taking antibiotics alone usually does not cure an abscess. Your caregiver may need to make a small cut (incision) in the abscess to drain the pus. In some cases, gauze is packed into the abscess to reduce pain and to continue draining the area. HOME CARE INSTRUCTIONS   Only take over-the-counter or prescription medicines for pain, discomfort, or fever as directed by your caregiver.  If you were prescribed antibiotics, take them as directed. Finish them even if you  start to feel better.  If gauze is used, follow your caregiver's directions for changing the gauze.  To avoid spreading the infection:  Keep your draining abscess covered with a bandage.  Wash your hands well.  Do not share personal care items, towels, or whirlpools with others.  Avoid skin contact with others.  Keep your skin and clothes clean around the abscess.  Keep all follow-up appointments as directed by your caregiver. SEEK MEDICAL CARE IF:   You have increased pain, swelling, redness, fluid drainage, or bleeding.  You have muscle aches, chills, or a general ill feeling.  You have a fever. MAKE SURE YOU:   Understand these instructions.  Will watch your condition.  Will get help right away if you are not doing well or get worse.   This information is not intended to replace advice given to you by your health care provider. Make sure you discuss any questions you have with your health care provider.   Document Released: 09/25/2005 Document Revised: 06/16/2012 Document Reviewed: 02/28/2012 Elsevier Interactive Patient Education [employer].

## 2016-04-04 NOTE — Telephone Encounter (Signed)
Pt has been sch

## 2016-04-04 NOTE — Telephone Encounter (Signed)
Please schedule as instructed.  Thanks!

## 2016-04-04 NOTE — Progress Notes (Signed)
Pre visit review using our clinic review tool, if applicable. No additional management support is needed unless otherwise documented below in the visit note.   Chief Complaint  Patient presents with  . cyst on back    HPI: David 55 y.o.  [place]   n for sda with swelling infected cyst on back for about 3 weeks waxing and waning but now for the last days painful to sleep  No fever  .   Has had smaller ones  Elsewhere that drain.  Well otherwise doing marathons training. ROS: See pertinent positives and negatives per HPI.  Past Medical History  Diagnosis Date  . History of diverticulitis of colon 8/05  . Hyperlipidemia   . Hypertension     off med after lifestyle intervention contolled  . Hyperglycemia     Family History  Problem Relation Age of Onset  . Stroke Father     in hot weather  . Arthritis Neg Hx   . Stroke Other   . Breast cancer Mother     Social History   Social History  . Marital Status: Married    Spouse Name: N/A  . Number of Children: N/A  . Years of Education: N/A   Social History Main Topics  . Smoking status: Never Smoker   . Smokeless tobacco: None  . Alcohol Use: Yes     Comment: occasional  . Drug Use: No  . Sexual Activity: Not Asked   Other Topics Concern  . None   Social History Narrative   Married with children   hhof 5   2 dogs    Former smoker   Regular exercise-yes  does marathon    Is [place]   No est. Runs   Sleep adequate   40- 50 jours per week.                   Outpatient Prescriptions Prior to Visit  Medication Sig Dispense Refill  . aspirin 81 MG tablet Take 81 mg by mouth daily.      Marland Kitchen lisinopril (PRINIVIL,ZESTRIL) 5 MG tablet TAKE 1 TABLET DAILY 90 tablet 0  . metFORMIN (GLUCOPHAGE) 500 MG tablet 3 (three) times daily before meals. 2 tabs q am and 1 q hs.    . simvastatin (ZOCOR) 40 MG tablet TAKE 1 TABLET AT BEDTIME 90 tablet 0  . metFORMIN (GLUCOPHAGE) 500 MG tablet TAKE 1 TABLET  THREE TIMES A DAY BEFORE MEALS 270 tablet 0   No facility-administered medications prior to visit.     EXAM:  BP 124/80 mmHg  Pulse 90  Temp(Src) 99.1 F (37.3 C) (Oral)  Wt 205 lb (92.987 kg)  Body mass index is 28.6 kg/(m^2).  GENERAL: vitals reviewed and listed above, alert, oriented, appears well hydrated and in no acute distress HEENT: atraumatic, conjunctiva  clear, no obvious abnormalities on inspection of external nose and ears  SKIN there is a very large 7-8 cm area of redness with central induration and fluctuance  on left flank back area   No pore opening  Tender      After discussion and permission  Risk benefit  Discussed.  [procedure :  1 Total visit 5mins > 50% spent counseling and coordinating care as indicated in above note and in instructions to patient .    1.5 cc of 1% lidocaine with epi  After freeze    # 11 blade scalpel make 1.5-2 cm incision   And copious pus drained  and milked  exposed with  Hemostat and pick ups to 3-4 cm    Packing  iodiform guaze 12 cm   Pt tolerated well   Areas of redness and induration much improved    PSYCH: pleasant and cooperative, no obvious depression or anxiety  ASSESSMENT AND PLAN:  Discussed the following assessment and plan:  Infected cyst of skin  Cellulitis and abscess Local care       Expectant management. Begin antibiotic  diclox icillinqid  Tid if not tolerated  ROV 1 weeks but if  Better and not feel need to be seen can  Call ahead with status report and cancel appt .  -Patient advised to return or notify health care team  if symptoms worsen ,persist or new concerns arise.  Patient Instructions   The packing will fall out  But leave in  To keep  Area open to drain more. Hot shower and compresses to help  Antibiotic. Can recheck the area in a week if not a lot better  Or if worse .    Abscess An abscess is an infected area that contains a collection of pus and debris.It can occur in almost any part of the  body. An abscess is also known as a furuncle or boil. CAUSES  An abscess occurs when tissue gets infected. This can occur from blockage of oil or sweat glands, infection of hair follicles, or a minor injury to the skin. As the body tries to fight the infection, pus collects in the area and creates pressure under the skin. This pressure causes pain. People with weakened immune systems have difficulty fighting infections and get certain abscesses more often.  SYMPTOMS Usually an abscess develops on the skin and becomes a painful mass that is red, warm, and tender. If the abscess forms under the skin, you may feel a moveable soft area under the skin. Some abscesses break open (rupture) on their own, but most will continue to get worse without care. The infection can spread deeper into the body and eventually into the bloodstream, causing you to feel ill.  DIAGNOSIS  Your caregiver will take your medical history and perform a physical exam. A sample of fluid may also be taken from the abscess to determine what is causing your infection. TREATMENT  Your caregiver may prescribe antibiotic medicines to fight the infection. However, taking antibiotics alone usually does not cure an abscess. Your caregiver may need to make a small cut (incision) in the abscess to drain the pus. In some cases, gauze is packed into the abscess to reduce pain and to continue draining the area. HOME CARE INSTRUCTIONS   Only take over-the-counter or prescription medicines for pain, discomfort, or fever as directed by your caregiver.  If you were prescribed antibiotics, take them as directed. Finish them even if you start to feel better.  If gauze is used, follow your caregiver's directions for changing the gauze.  To avoid spreading the infection:  Keep your draining abscess covered with a bandage.  Wash your hands well.  Do not share personal care items, towels, or whirlpools with others.  Avoid skin contact with  others.  Keep your skin and clothes clean around the abscess.  Keep all follow-up appointments as directed by your caregiver. SEEK MEDICAL CARE IF:   You have increased pain, swelling, redness, fluid drainage, or bleeding.  You have muscle aches, chills, or a general ill feeling.  You have a fever. MAKE SURE YOU:   Understand these  instructions.  Will watch your condition.  Will get help right away if you are not doing well or get worse.   This information is not intended to replace advice given to you by your health care provider. Make sure you discuss any questions you have with your health care provider.   Document Released: 09/25/2005 Document Revised: 06/16/2012 Document Reviewed: 02/28/2012 Elsevier Interactive Patient Education 2016 [place] [employer] [place] [employer]. Panosh M.D.

## 2016-04-10 NOTE — Progress Notes (Signed)
Chief Complaint  Patient presents with  . Follow-up    HPI: David Cop[place] 55 y.o.  Fu  Infected cyst abscess   Extensive area drainged and packed last week  Clear  Stuff coming out until yesterday and packing removed.     Finishing antibiotic  Had fever 1-1 and then sweat the night of the   i and d   Then gone  Now itching  Pain is pretty much gone .  ROS: See pertinent positives and negatives per HPI.  Past Medical History  Diagnosis Date  . History of diverticulitis of colon 8/05  . Hyperlipidemia   . Hypertension     off med after lifestyle intervention contolled  . Hyperglycemia     Family History  Problem Relation Age of Onset  . Stroke Father     in hot weather  . Arthritis Neg Hx   . Stroke Other   . Breast cancer Mother     Social History   Social History  . Marital Status: Married    Spouse Name: N/A  . Number of Children: N/A  . Years of Education: N/A   Social History Main Topics  . Smoking status: Never Smoker   . Smokeless tobacco: None  . Alcohol Use: Yes     Comment: occasional  . Drug Use: No  . Sexual Activity: Not Asked   Other Topics Concern  . None   Social History Narrative   Married with children   hhof 5   2 dogs    Former smoker   Regular exercise-yes  does marathon    [place]   No est. Runs   Sleep adequate   40- 50 jours per week.                   Outpatient Prescriptions Prior to Visit  Medication Sig Dispense Refill  . aspirin 81 MG tablet Take 81 mg by mouth daily.      Marland Kitchen lisinopril (PRINIVIL,ZESTRIL) 5 MG tablet TAKE 1 TABLET DAILY 90 tablet 0  . metFORMIN (GLUCOPHAGE) 500 MG tablet 3 (three) times daily before meals. 2 tabs q am and 1 q hs.    . simvastatin (ZOCOR) 40 MG tablet TAKE 1 TABLET AT BEDTIME 90 tablet 0  . dicloxacillin (DYNAPEN) 500 MG capsule Take 1 capsule (500 mg total) by mouth 4 (four) times daily. 28 capsule 0   No facility-administered medications prior to visit.      EXAM:  BP 130/80 mmHg  Temp(Src) 98.5 F (36.9 C) (Oral)  Wt 207 lb 6.4 oz (94.076 kg)  Body mass index is 28.94 kg/(m^2).  GENERAL: vitals reviewed and listed above, alert, oriented, appears well hydrated and in no acute distress\ Back  Left cyst area  Incision is now closing up  much less   Redness and swelling and no fluctuance but has about 2-3 cm elevated area non tender or fluctuant   Some  Fading redness  Lateral non tender   PSYCH: pleasant and cooperative, no obvious depression or anxiety  ASSESSMENT AND PLAN:  Discussed the following assessment and plan:  Cellulitis and abscess - improved but    needs fu cont antibiotic tid for another week if not cont to inprove  get surgery consult or if relapses   Infected cyst of skin  -Patient advised to return or notify health care team  if symptoms worsen ,persist or new concerns arise.  Patient Instructions  Looks about 18 -  80 % better but still  Want you to stay on    Antibiotic    For another week .  Cant take 3 x per day instead of 4 x per day.      rov in a week    Can cancel if doing well.  If  persistent or progressive we may get surgery to see your .    Standley Brooking. Leomar Westberg M.D.

## 2016-04-11 ENCOUNTER — Encounter: Payer: Self-pay | Admitting: Internal Medicine

## 2016-04-11 ENCOUNTER — Ambulatory Visit (INDEPENDENT_AMBULATORY_CARE_PROVIDER_SITE_OTHER): Payer: BLUE CROSS/BLUE SHIELD | Admitting: Internal Medicine

## 2016-04-11 VITALS — BP 130/80 | Temp 98.5°F | Wt 207.4 lb

## 2016-04-11 DIAGNOSIS — L729 Follicular cyst of the skin and subcutaneous tissue, unspecified: Secondary | ICD-10-CM

## 2016-04-11 DIAGNOSIS — L089 Local infection of the skin and subcutaneous tissue, unspecified: Secondary | ICD-10-CM

## 2016-04-11 DIAGNOSIS — L0390 Cellulitis, unspecified: Secondary | ICD-10-CM

## 2016-04-11 DIAGNOSIS — L0291 Cutaneous abscess, unspecified: Secondary | ICD-10-CM

## 2016-04-11 MED ORDER — DICLOXACILLIN SODIUM 500 MG PO CAPS: 500.0000 mg | ORAL_CAPSULE | Freq: Three times a day (TID) | ORAL | Status: DC

## 2016-04-11 NOTE — Patient Instructions (Signed)
Looks about 70 -80 % better but still  Want you to stay on    Antibiotic    For another week .  Cant take 3 x per day instead of 4 x per day.      rov in a week    Can cancel if doing well.  If  persistent or progressive we may get surgery to see your .

## 2016-04-19 ENCOUNTER — Encounter: Payer: Self-pay | Admitting: Internal Medicine

## 2016-04-22 ENCOUNTER — Ambulatory Visit: Payer: BLUE CROSS/BLUE SHIELD | Admitting: Internal Medicine

## 2016-04-24 NOTE — Telephone Encounter (Signed)
Agree Ok to just watch it   For now  And if  Relapsing contact us.

## 2016-04-28 ENCOUNTER — Other Ambulatory Visit: Payer: Self-pay | Admitting: Internal Medicine

## 2016-04-29 NOTE — Telephone Encounter (Signed)
Sent to the pharmacy by e-scribe.  Pt has upcoming cpx this month

## 2016-05-08 ENCOUNTER — Other Ambulatory Visit (INDEPENDENT_AMBULATORY_CARE_PROVIDER_SITE_OTHER): Payer: BLUE CROSS/BLUE SHIELD

## 2016-05-08 DIAGNOSIS — R739 Hyperglycemia, unspecified: Secondary | ICD-10-CM

## 2016-05-08 DIAGNOSIS — Z0000 Encounter for general adult medical examination without abnormal findings: Secondary | ICD-10-CM

## 2016-05-08 LAB — HEMOGLOBIN A1C: HEMOGLOBIN A1C: 6.7 % — AB (ref 4.6–6.5)

## 2016-05-08 LAB — BASIC METABOLIC PANEL
BUN: 15 mg/dL (ref 6–23)
CALCIUM: 9.5 mg/dL (ref 8.4–10.5)
CO2: 26 meq/L (ref 19–32)
CREATININE: 0.99 mg/dL (ref 0.40–1.50)
Chloride: 105 mEq/L (ref 96–112)
GFR: 84.2 mL/min (ref >60.00)
GLUCOSE: 136 mg/dL — AB (ref 70–99)
Potassium: 4.5 mEq/L (ref 3.5–5.1)
Sodium: 140 mEq/L (ref 135–145)

## 2016-05-08 LAB — PSA: PSA: 1.27 ng/mL (ref 0.10–4.00)

## 2016-05-08 LAB — CBC WITH DIFFERENTIAL/PLATELET
BASOS ABS: 0 10*3/uL (ref 0.0–0.1)
Basophils Relative: 0.3 % (ref 0.0–3.0)
EOS ABS: 0.1 10*3/uL (ref 0.0–0.7)
Eosinophils Relative: 1.6 % (ref 0.0–5.0)
HCT: 42 % (ref 39.0–52.0)
Hemoglobin: 14.2 g/dL (ref 13.0–17.0)
LYMPHS ABS: 1.8 10*3/uL (ref 0.7–4.0)
Lymphocytes Relative: 30.2 % (ref 12.0–46.0)
MCHC: 33.8 g/dL (ref 30.0–36.0)
MCV: 86.2 fl (ref 78.0–100.0)
MONO ABS: 0.3 10*3/uL (ref 0.1–1.0)
Monocytes Relative: 4.9 % (ref 3.0–12.0)
NEUTROS ABS: 3.8 10*3/uL (ref 1.4–7.7)
NEUTROS PCT: 63 % (ref 43.0–77.0)
PLATELETS: 180 10*3/uL (ref 150.0–400.0)
RBC: 4.87 Mil/uL (ref 4.22–5.81)
RDW: 14.2 % (ref 11.5–15.5)
WBC: 6 10*3/uL (ref 4.0–10.5)

## 2016-05-08 LAB — TSH: TSH: 1.71 u[IU]/mL (ref 0.35–4.50)

## 2016-05-08 LAB — LIPID PANEL
CHOLESTEROL: 189 mg/dL (ref 0–200)
HDL: 59.1 mg/dL (ref >39.00)
LDL Cholesterol: 114 mg/dL — ABNORMAL HIGH (ref 0–99)
NONHDL: 130.21
TRIGLYCERIDES: 81 mg/dL (ref 0.0–149.0)
Total CHOL/HDL Ratio: 3
VLDL: 16.2 mg/dL (ref 0.0–40.0)

## 2016-05-08 LAB — HEPATIC FUNCTION PANEL
ALK PHOS: 45 U/L (ref 39–117)
ALT: 22 U/L (ref 0–53)
AST: 19 U/L (ref 0–37)
Albumin: 4.5 g/dL (ref 3.5–5.2)
BILIRUBIN DIRECT: 0.1 mg/dL (ref 0.0–0.3)
TOTAL PROTEIN: 6.8 g/dL (ref 6.0–8.3)
Total Bilirubin: 0.6 mg/dL (ref 0.2–1.2)

## 2016-05-14 NOTE — Patient Instructions (Addendum)
Get your colonscopy when you can  Exam is good .  Work on adheracne to the 1500 mg per day of metformin .  ROV in 4-6 monhts  And will do a1c at the office visit   To decide  Management  Gt an eye exam when you can   Health Maintenance, Male A healthy lifestyle and preventative care can promote health and wellness.  Maintain regular health, dental, and eye exams.  Eat a healthy diet. Foods like vegetables, fruits, whole grains, low-fat dairy products, and lean protein foods contain the nutrients you need and are low in calories. Decrease your intake of foods high in solid fats, added sugars, and salt. Get information about a proper diet from your health care provider, if necessary.  Regular physical exercise is one of the most important things you can do for your health. Most adults should get at least 150 minutes of moderate-intensity exercise (any activity that increases your heart rate and causes you to sweat) each week. In addition, most adults need muscle-strengthening exercises on 2 or more days a week.   Maintain a healthy weight. The body mass index (BMI) is a screening tool to identify possible weight problems. It provides an estimate of body fat based on height and weight. Your health care provider can find your BMI and can help you achieve or maintain a healthy weight. For males 20 years and older:  A BMI below 18.5 is considered underweight.  A BMI of 18.5 to 24.9 is normal.  A BMI of 25 to 29.9 is considered overweight.  A BMI of 30 and above is considered obese.  Maintain normal blood lipids and cholesterol by exercising and minimizing your intake of saturated fat. Eat a balanced diet with plenty of fruits and vegetables. Blood tests for lipids and cholesterol should begin at age 45 and be repeated every 5 years. If your lipid or cholesterol levels are high, you are over age 79, or you are at high risk for heart disease, you may need your cholesterol levels checked more  frequently.Ongoing high lipid and cholesterol levels should be treated with medicines if diet and exercise are not working.  If you smoke, find out from your health care provider how to quit. If you do not use tobacco, do not start.  Lung cancer screening is recommended for adults aged 51-80 years who are at high risk for developing lung cancer because of a history of smoking. A yearly low-dose CT scan of the lungs is recommended for people who have at least a 30-pack-year history of smoking and are current smokers or have quit within the past 15 years. A pack year of smoking is smoking an average of 1 pack of cigarettes a day for 1 year (for example, a 30-pack-year history of smoking could mean smoking 1 pack a day for 30 years or 2 packs a day for 15 years). Yearly screening should continue until the smoker has stopped smoking for at least 15 years. Yearly screening should be stopped for people who develop a health problem that would prevent them from having lung cancer treatment.  If you choose to drink alcohol, do not have more than 2 drinks per day. One drink is considered to be 12 oz (360 mL) of beer, 5 oz (150 mL) of wine, or 1.5 oz (45 mL) of liquor.  Avoid the use of street drugs. Do not share needles with anyone. Ask for help if you need support or instructions about stopping the use  of drugs.  High blood pressure causes heart disease and increases the risk of stroke. High blood pressure is more likely to develop in:  People who have blood pressure in the end of the normal range (100-139/85-89 mm Hg).  People who are overweight or obese.  People who are African American.  If you are 3-84 years of age, have your blood pressure checked every 3-5 years. If you are 50 years of age or older, have your blood pressure checked every year. You should have your blood pressure measured twice--once when you are at a hospital or clinic, and once when you are not at a hospital or clinic. Record the  average of the two measurements. To check your blood pressure when you are not at a hospital or clinic, you can use:  An automated blood pressure machine at a pharmacy.  A home blood pressure monitor.  If you are 30-41 years old, ask your health care provider if you should take aspirin to prevent heart disease.  Diabetes screening involves taking a blood sample to check your fasting blood sugar level. This should be done once every 3 years after age 54 if you are at a normal weight and without risk factors for diabetes. Testing should be considered at a younger age or be carried out more frequently if you are overweight and have at least 1 risk factor for diabetes.  Colorectal cancer can be detected and often prevented. Most routine colorectal cancer screening begins at the age of 13 and continues through age 42. However, your health care provider may recommend screening at an earlier age if you have risk factors for colon cancer. On a yearly basis, your health care provider may provide home test kits to check for hidden blood in the stool. A small camera at the end of a tube may be used to directly examine the colon (sigmoidoscopy or colonoscopy) to detect the earliest forms of colorectal cancer. Talk to your health care provider about this at age 44 when routine screening begins. A direct exam of the colon should be repeated every 5-10 years through age 71, unless early forms of precancerous polyps or small growths are found.  People who are at an increased risk for hepatitis B should be screened for this virus. You are considered at high risk for hepatitis B if:  You were born in a country where hepatitis B occurs often. Talk with your health care provider about which countries are considered high risk.  Your parents were born in a high-risk country and you have not received a shot to protect against hepatitis B (hepatitis B vaccine).  You have HIV or AIDS.  You use needles to inject street  drugs.  You live with, or have sex with, someone who has hepatitis B.  You are a man who has sex with other men (MSM).  You get hemodialysis treatment.  You take certain medicines for conditions like cancer, organ transplantation, and autoimmune conditions.  Hepatitis C blood testing is recommended for all people born from [birth year] through 1963 and any individual with known risk factors for hepatitis C.  Healthy men should no longer receive prostate-specific antigen (PSA) blood tests as part of routine cancer screening. Talk to your health care provider about prostate cancer screening.  Testicular cancer screening is not recommended for adolescents or adult males who have no symptoms. Screening includes self-exam, a health care provider exam, and other screening tests. Consult with your health care provider about any symptoms you  have or any concerns you have about testicular cancer.  Practice safe sex. Use condoms and avoid high-risk sexual practices to reduce the spread of sexually transmitted infections (STIs).  You should be screened for STIs, including gonorrhea and chlamydia if:  You are sexually active and are younger than 24 years.  You are older than 24 years, and your health care provider tells you that you are at risk for this type of infection.  Your sexual activity has changed since you were last screened, and you are at an increased risk for chlamydia or gonorrhea. Ask your health care provider if you are at risk.  If you are at risk of being infected with HIV, it is recommended that you take a prescription medicine daily to prevent HIV infection. This is called pre-exposure prophylaxis (PrEP). You are considered at risk if:  You are a man who has sex with other men (MSM).  You are a heterosexual man who is sexually active with multiple partners.  You take drugs by injection.  You are sexually active with a partner who has HIV.  Talk with your health care provider about  whether you are at high risk of being infected with HIV. If you choose to begin PrEP, you should first be tested for HIV. You should then be tested every 3 months for as long as you are taking PrEP.  Use sunscreen. Apply sunscreen liberally and repeatedly throughout the day. You should seek shade when your shadow is shorter than you. Protect yourself by wearing long sleeves, pants, a wide-brimmed hat, and sunglasses year round whenever you are outdoors.  Tell your health care provider of new moles or changes in moles, especially if there is a change in shape or color. Also, tell your health care provider if a mole is larger than the size of a pencil eraser.  A one-time screening for abdominal aortic aneurysm (AAA) and surgical repair of large AAAs by ultrasound is recommended for men aged 61-75 years who are current or former smokers.  Stay current with your vaccines (immunizations).   This information is not intended to replace advice given to you by your health care provider. Make sure you discuss any questions you have with your health care provider.   Document Released: 06/13/2008 Document Revised: 01/06/2015 Document Reviewed: 05/13/2011 Elsevier Interactive Patient Education [employer].

## 2016-05-14 NOTE — Progress Notes (Signed)
Chief Complaint  Patient presents with  . Annual Exam    HPI: Patient  David Copeland  55 y.o. comes in today for Preventive Health Care visit  Abscess cyst all better now. Dm forgets pm metformin  recently continues exercise  Running  sometimes carb snacks no numbness  No eye check but ok  Had to change colonoscopy cause of wife s breast cancer dx  No sx   Health Maintenance  Topic Date Due  . FOOT EXAM  10/24/1973  . OPHTHALMOLOGY EXAM  10/24/1973  . HIV Screening  10/24/1978  . PNEUMOCOCCAL POLYSACCHARIDE VACCINE (2) 06/24/2010  . COLONOSCOPY  10/24/2013  . INFLUENZA VACCINE  07/30/2016  . HEMOGLOBIN A1C  11/08/2016  . TETANUS/TDAP  05/16/2019  . Hepatitis C Screening  Completed   Health Maintenance Review LIFESTYLE:  Exercise:   5 x per week  Tobacco/ETS:no Alcohol:  3 per week  Sugar beverages:no Sleep: trying. 8  7  Drug use: no  ROS:  GEN/ HEENT: No fever, significant weight changes sweats headaches vision problems hearing changes, CV/ PULM; No chest pain shortness of breath cough, syncope,edema  change in exercise tolerance. GI /GU: No adominal pain, vomiting, change in bowel habits. No blood in the stool. No significant GU symptoms. SKIN/HEME: ,no acute skin rashes suspicious lesions or bleeding. No lymphadenopathy, nodules, masses.  NEURO/ PSYCH:  No neurologic signs such as weakness numbness. No depression anxiety. IMM/ Allergy: No unusual infections.  Allergy .   REST of 12 system review negative except as per HPI   Past Medical History  Diagnosis Date  . History of diverticulitis of colon 8/05  . Hyperlipidemia   . Hypertension     off med after lifestyle intervention contolled  . Hyperglycemia     Past Surgical History  Procedure Laterality Date  . Removed breast glands      damaged secondary to soccer  . Wisdom tooth extraction    . Adenoidectomy    . Tonsillectomy      Family History  Problem Relation Age of Onset  . Stroke  Father     in hot weather  . Arthritis Neg Hx   . Stroke Other   . Breast cancer Mother     Social History   Social History  . Marital Status: Married    Spouse Name: N/A  . Number of Children: N/A  . Years of Education: N/A   Social History Main Topics  . Smoking status: Never Smoker   . Smokeless tobacco: None  . Alcohol Use: Yes     Comment: occasional  . Drug Use: No  . Sexual Activity: Not Asked   Other Topics Concern  . None   Social History Narrative   Married with children   hhof 5   2 dogs    Former smoker   Regular exercise-yes  does marathon    Is [place]   No est. Runs   Sleep adequate   40- 50 jours per week.                   Outpatient Prescriptions Prior to Visit  Medication Sig Dispense Refill  . aspirin 81 MG tablet Take 81 mg by mouth daily.      Marland Kitchen lisinopril (PRINIVIL,ZESTRIL) 5 MG tablet TAKE 1 TABLET DAILY 90 tablet 0  . metFORMIN (GLUCOPHAGE) 500 MG tablet 3 (three) times daily before meals. 2 tabs q am and 1 q hs.    Marland Kitchen  simvastatin (ZOCOR) 40 MG tablet TAKE 1 TABLET AT BEDTIME (Patient taking differently: TAKE 1 TABLET IN THE MORNING) 90 tablet 0  . dicloxacillin (DYNAPEN) 500 MG capsule Take 1 capsule (500 mg total) by mouth 3 (three) times daily. 28 capsule 0   No facility-administered medications prior to visit.     EXAM:  BP 151/91 mmHg  Pulse 67  Temp(Src) 98.7 F (37.1 C) (Oral)  Ht 5\' 11"  (1.803 m)  Wt 206 lb (93.441 kg)  BMI 28.74 kg/m2  SpO2 99%  Body mass index is 28.74 kg/(m^2).  Physical Exam: Vital signs reviewed [id number] is a well-developed well-nourished alert cooperative    who appearsr stated age in no acute distress.  HEENT: normocephalic atraumatic , Eyes: PERRL EOM's full, conjunctiva clear, Nares: paten,t no deformity discharge or tenderness., Ears: no deformity EAC's clear TMs with normal landmarks. Mouth: clear OP, no lesions, edema.  Moist mucous membranes. Dentition in adequate  repair. NECK: supple without masses, thyromegaly or bruits. CHEST/PULM:  Clear to auscultation and percussion breath sounds equal no wheeze , rales or rhonchi. No chest wall deformities or tenderness. CV: PMI is nondisplaced, S1 S2 no gallops, murmurs, rubs. Peripheral pulses are full without delay.No JVD .  ABDOMEN: Bowel sounds normal nontender  No guard or rebound, no hepato splenomegal no CVA tenderness.  No hernia. Extremtities:  No clubbing cyanosis or edema, no acute joint swelling or redness no focal atrophy NEURO:  Oriented x3, cranial nerves 3-12 appear to be intact, no obvious focal weakness,gait within normal limits no abnormal reflexes or asymmetrical SKIN: No acute rashes normal turgor, color, no bruising or petechiae. Cyst area  Resolved some pigmentation otherwise  Rectal prostate 1 + no nodule no masses heme negative  PSYCH: Oriented, good eye contact, no obvious depression anxiety, cognition and judgment appear normal. LN: no cervical axillary inguinal adenopathy  Lab Results  Component Value Date   WBC 6.0 05/08/2016   HGB 14.2 05/08/2016   HCT 42.0 05/08/2016   PLT 180.0 05/08/2016   GLUCOSE 136* 05/08/2016   CHOL 189 05/08/2016   TRIG 81.0 05/08/2016   HDL 59.10 05/08/2016   LDLCALC 114* 05/08/2016   ALT 22 05/08/2016   AST 19 05/08/2016   NA 140 05/08/2016   K 4.5 05/08/2016   CL 105 05/08/2016   CREATININE 0.99 05/08/2016   BUN 15 05/08/2016   CO2 26 05/08/2016   TSH 1.71 05/08/2016   PSA 1.27 05/08/2016   HGBA1C 6.7* 05/08/2016   MICROALBUR 0.3 11/23/2013    ASSESSMENT AND PLAN:  Discussed the following assessment and plan:  Encounter for preventive health examination  Essential hypertension - up today but at goal at home  Impaired fasting glucose  Hyperlipidemia  Patient Care Team: Burnis Medin, MD as PCP - General Juanita Craver, MD as Consulting Physician (Gastroenterology) Patient Instructions  Get your colonscopy when you can  Exam is  good .  Work on adheracne to the 1500 mg per day of metformin .  ROV in 4-6 monhts  And will do a1c at the office visit   To decide  Management  Gt an eye exam when you can   Health Maintenance, Male A healthy lifestyle and preventative care can promote health and wellness.  Maintain regular health, dental, and eye exams.  Eat a healthy diet. Foods like vegetables, fruits, whole grains, low-fat dairy products, and lean protein foods contain the nutrients you need and are low in calories. Decrease your intake of foods high  in solid fats, added sugars, and salt. Get information about a proper diet from your health care provider, if necessary.  Regular physical exercise is one of the most important things you can do for your health. Most adults should get at least 150 minutes of moderate-intensity exercise (any activity that increases your heart rate and causes you to sweat) each week. In addition, most adults need muscle-strengthening exercises on 2 or more days a week.   Maintain a healthy weight. The body mass index (BMI) is a screening tool to identify possible weight problems. It provides an estimate of body fat based on height and weight. Your health care provider can find your BMI and can help you achieve or maintain a healthy weight. For males 20 years and older:  A BMI below 18.5 is considered underweight.  A BMI of 18.5 to 24.9 is normal.  A BMI of 25 to 29.9 is considered overweight.  A BMI of 30 and above is considered obese.  Maintain normal blood lipids and cholesterol by exercising and minimizing your intake of saturated fat. Eat a balanced diet with plenty of fruits and vegetables. Blood tests for lipids and cholesterol should begin at age 66 and be repeated every 5 years. If your lipid or cholesterol levels are high, you are over age 70, or you are at high risk for heart disease, you may need your cholesterol levels checked more frequently.Ongoing high lipid and cholesterol  levels should be treated with medicines if diet and exercise are not working.  If you smoke, find out from your health care provider how to quit. If you do not use tobacco, do not start.  Lung cancer screening is recommended for adults aged 24-80 years who are at high risk for developing lung cancer because of a history of smoking. A yearly low-dose CT scan of the lungs is recommended for people who have at least a 30-pack-year history of smoking and are current smokers or have quit within the past 15 years. A pack year of smoking is smoking an average of 1 pack of cigarettes a day for 1 year (for example, a 30-pack-year history of smoking could mean smoking 1 pack a day for 30 years or 2 packs a day for 15 years). Yearly screening should continue until the smoker has stopped smoking for at least 15 years. Yearly screening should be stopped for people who develop a health problem that would prevent them from having lung cancer treatment.  If you choose to drink alcohol, do not have more than 2 drinks per day. One drink is considered to be 12 oz (360 mL) of beer, 5 oz (150 mL) of wine, or 1.5 oz (45 mL) of liquor.  Avoid the use of street drugs. Do not share needles with anyone. Ask for help if you need support or instructions about stopping the use of drugs.  High blood pressure causes heart disease and increases the risk of stroke. High blood pressure is more likely to develop in:  People who have blood pressure in the end of the normal range (100-139/85-89 mm Hg).  People who are overweight or obese.  People who are African American.  If you are 75-22 years of age, have your blood pressure checked every 3-5 years. If you are 60 years of age or older, have your blood pressure checked every year. You should have your blood pressure measured twice--once when you are at a hospital or clinic, and once when you are not at a hospital  or clinic. Record the average of the two measurements. To check your  blood pressure when you are not at a hospital or clinic, you can use:  An automated blood pressure machine at a pharmacy.  A home blood pressure monitor.  If you are 29-33 years old, ask your health care provider if you should take aspirin to prevent heart disease.  Diabetes screening involves taking a blood sample to check your fasting blood sugar level. This should be done once every 3 years after age 17 if you are at a normal weight and without risk factors for diabetes. Testing should be considered at a younger age or be carried out more frequently if you are overweight and have at least 1 risk factor for diabetes.  Colorectal cancer can be detected and often prevented. Most routine colorectal cancer screening begins at the age of 32 and continues through age 46. However, your health care provider may recommend screening at an earlier age if you have risk factors for colon cancer. On a yearly basis, your health care provider may provide home test kits to check for hidden blood in the stool. A small camera at the end of a tube may be used to directly examine the colon (sigmoidoscopy or colonoscopy) to detect the earliest forms of colorectal cancer. Talk to your health care provider about this at age 48 when routine screening begins. A direct exam of the colon should be repeated every 5-10 years through age 54, unless early forms of precancerous polyps or small growths are found.  People who are at an increased risk for hepatitis B should be screened for this virus. You are considered at high risk for hepatitis B if:  You were born in a country where hepatitis B occurs often. Talk with your health care provider about which countries are considered high risk.  Your parents were born in a high-risk country and you have not received a shot to protect against hepatitis B (hepatitis B vaccine).  You have HIV or AIDS.  You use needles to inject street drugs.  You live with, or have sex with,  someone who has hepatitis B.  You are a man who has sex with other men (MSM).  You get hemodialysis treatment.  You take certain medicines for conditions like cancer, organ transplantation, and autoimmune conditions.  Hepatitis C blood testing is recommended for all people born from [birth year] through 1963 and any individual with known risk factors for hepatitis C.  Healthy men should no longer receive prostate-specific antigen (PSA) blood tests as part of routine cancer screening. Talk to your health care provider about prostate cancer screening.  Testicular cancer screening is not recommended for adolescents or adult males who have no symptoms. Screening includes self-exam, a health care provider exam, and other screening tests. Consult with your health care provider about any symptoms you have or any concerns you have about testicular cancer.  Practice safe sex. Use condoms and avoid high-risk sexual practices to reduce the spread of sexually transmitted infections (STIs).  You should be screened for STIs, including gonorrhea and chlamydia if:  You are sexually active and are younger than 24 years.  You are older than 24 years, and your health care provider tells you that you are at risk for this type of infection.  Your sexual activity has changed since you were last screened, and you are at an increased risk for chlamydia or gonorrhea. Ask your health care provider if you are at risk.  If  you are at risk of being infected with HIV, it is recommended that you take a prescription medicine daily to prevent HIV infection. This is called pre-exposure prophylaxis (PrEP). You are considered at risk if:  You are a man who has sex with other men (MSM).  You are a heterosexual man who is sexually active with multiple partners.  You take drugs by injection.  You are sexually active with a partner who has HIV.  Talk with your health care provider about whether you are at high risk of being  infected with HIV. If you choose to begin PrEP, you should first be tested for HIV. You should then be tested every 3 months for as long as you are taking PrEP.  Use sunscreen. Apply sunscreen liberally and repeatedly throughout the day. You should seek shade when your shadow is shorter than you. Protect yourself by wearing long sleeves, pants, a wide-brimmed hat, and sunglasses year round whenever you are outdoors.  Tell your health care provider of new moles or changes in moles, especially if there is a change in shape or color. Also, tell your health care provider if a mole is larger than the size of a pencil eraser.  A one-time screening for abdominal aortic aneurysm (AAA) and surgical repair of large AAAs by ultrasound is recommended for men aged 10-75 years who are current or former smokers.  Stay current with your vaccines (immunizations).   This information is not intended to replace advice given to you by your health care provider. Make sure you discuss any questions you have with your health care provider.   Document Released: 06/13/2008 Document Revised: 01/06/2015 Document Reviewed: 05/13/2011 Elsevier Interactive Patient Education 2016 [place]. Valecia Beske M.D.

## 2016-05-15 ENCOUNTER — Ambulatory Visit (INDEPENDENT_AMBULATORY_CARE_PROVIDER_SITE_OTHER): Payer: BLUE CROSS/BLUE SHIELD | Admitting: Internal Medicine

## 2016-05-15 ENCOUNTER — Encounter: Payer: Self-pay | Admitting: Internal Medicine

## 2016-05-15 VITALS — BP 151/91 | HR 67 | Temp 98.7°F | Ht 71.0 in | Wt 206.0 lb

## 2016-05-15 DIAGNOSIS — R7301 Impaired fasting glucose: Secondary | ICD-10-CM

## 2016-05-15 DIAGNOSIS — I10 Essential (primary) hypertension: Secondary | ICD-10-CM

## 2016-05-15 DIAGNOSIS — Z0000 Encounter for general adult medical examination without abnormal findings: Secondary | ICD-10-CM

## 2016-05-15 DIAGNOSIS — E785 Hyperlipidemia, unspecified: Secondary | ICD-10-CM | POA: Diagnosis not present

## 2016-05-15 NOTE — Assessment & Plan Note (Signed)
Controlled  At home 120/80  Continue nose of med

## 2016-05-15 NOTE — Assessment & Plan Note (Signed)
Worsening slightly   Intensify lifestyle interventions. Take metformin regularly Fu 4 months and a1c at that time

## 2016-07-22 ENCOUNTER — Other Ambulatory Visit: Payer: Self-pay | Admitting: Internal Medicine

## 2016-07-23 NOTE — Telephone Encounter (Signed)
Sent to the pharmacy by e-scribe for 90 days. 

## 2016-09-08 IMAGING — CR DG RIBS W/ CHEST 3+V*R*
3 series · 3 of 3 positions shown · non-contrast
Comparison: Two-view chest x-ray 11/10/2014.

CLINICAL DATA: Right lower rib pain and right flank pain over the
past 10 days. No recent injuries.

EXAM:
RIGHT RIBS AND CHEST - 3+ VIEW

[view not recorded (1 of 3)]
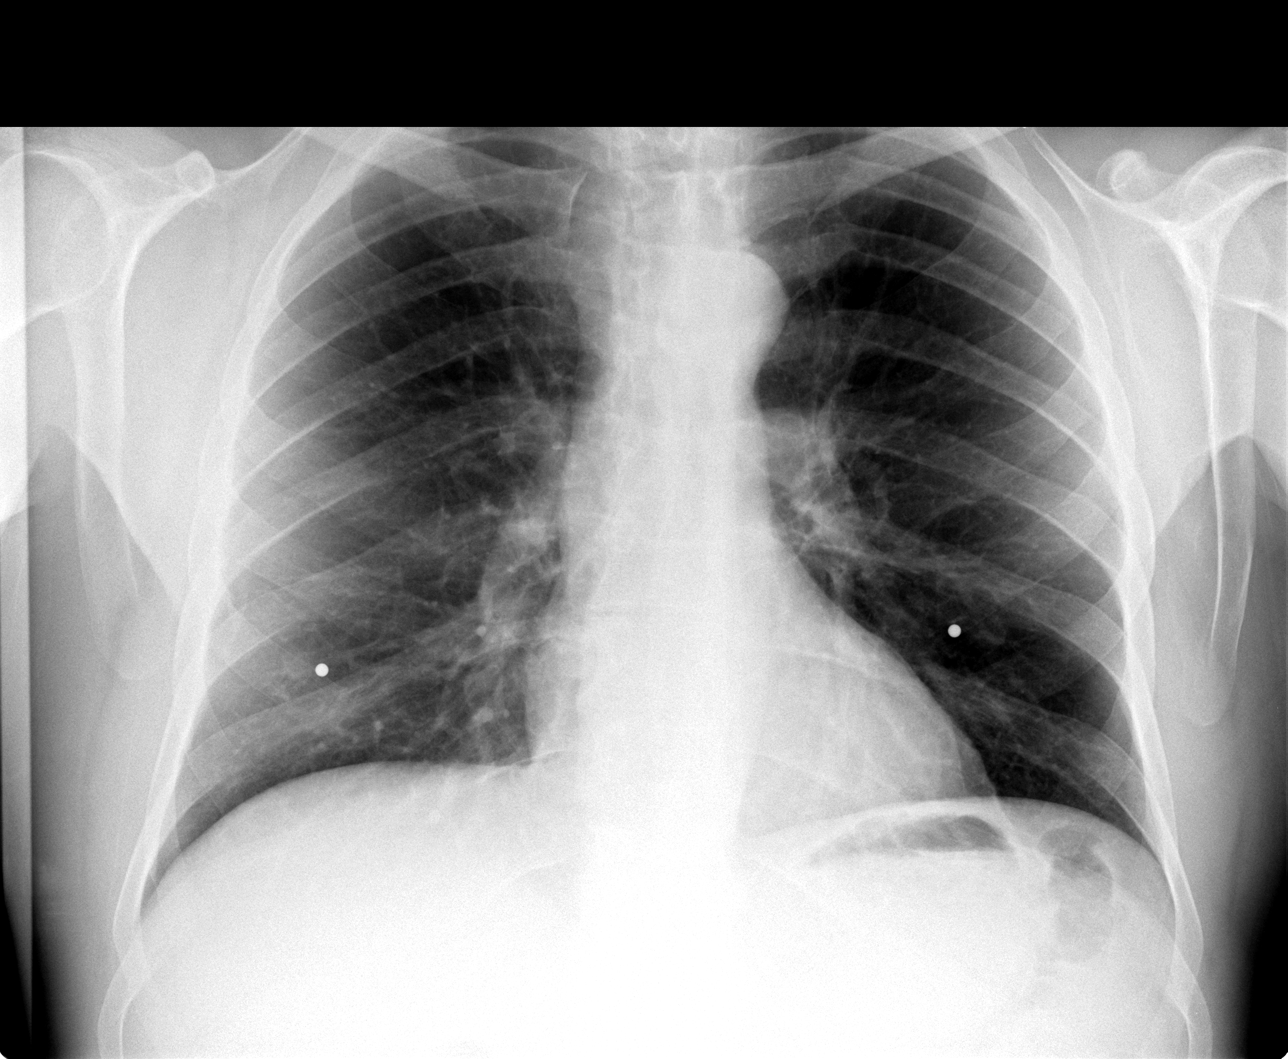

[view not recorded (2 of 3)]
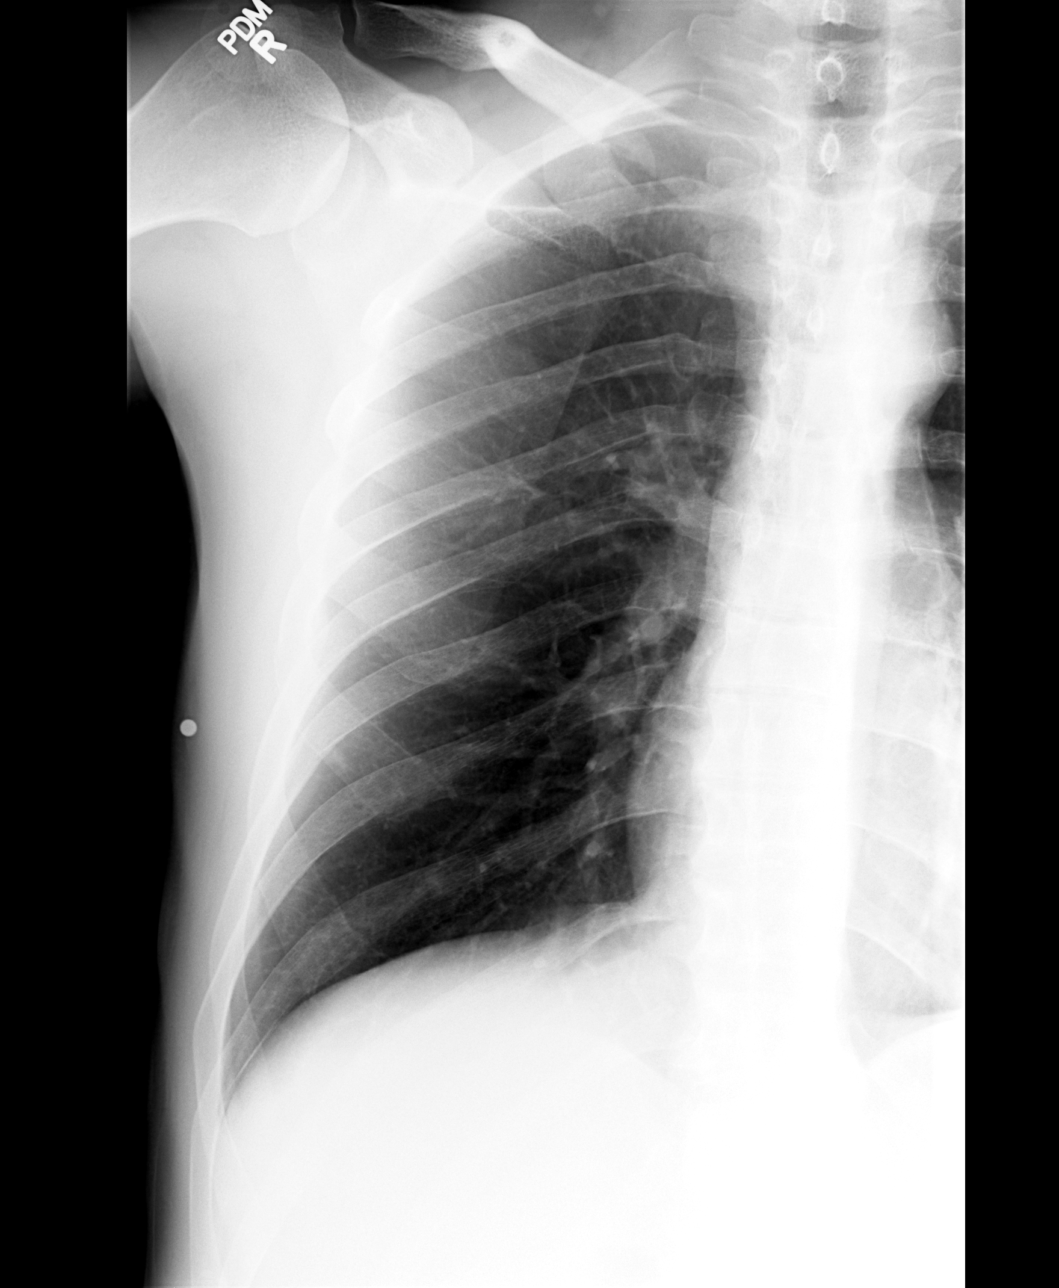

[view not recorded (3 of 3)]
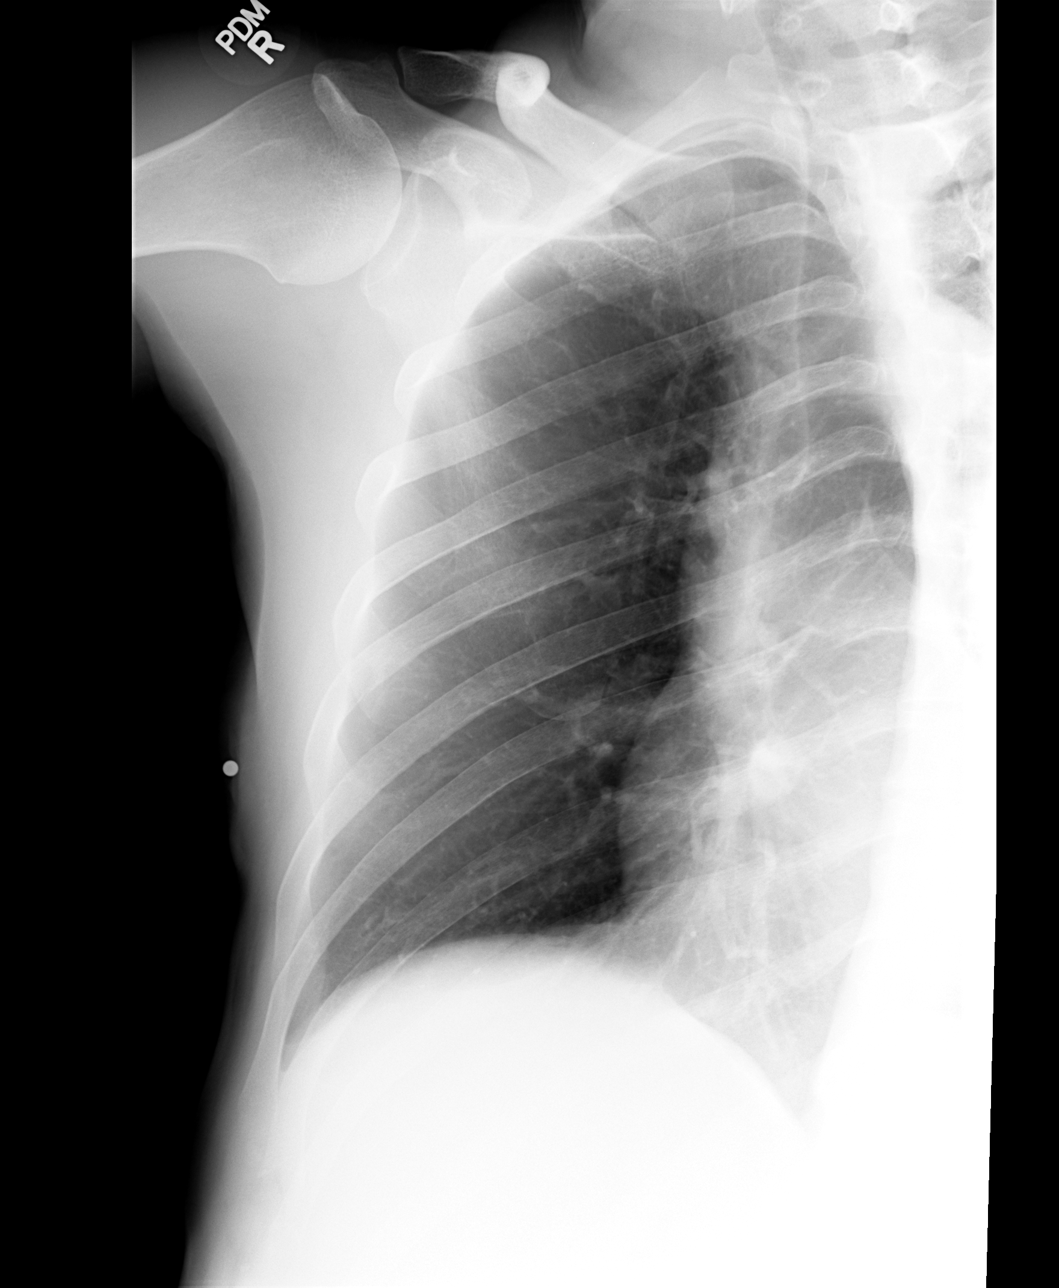

[3 of 3 positions shown; findings below may reference images not displayed]

FINDINGS: No acute fractures identified involving the ribs. No intrinsic
osseous abnormality. Well preserved bone mineral density.

Chest x-ray was performed with nipple markers in place, confirming
that the nodular opacity identified on the prior examination was in
fact the left nipple. Cardiomediastinal silhouette unremarkable.
Lungs clear. Bronchovascular markings normal. Pulmonary vascularity
normal. No visible pleural effusions. No pneumothorax.
IMPRESSION: 1. Normal right ribs.
2. No acute cardiopulmonary disease. The previously identified
nodular opacity projected over the left base on the prior chest
x-ray is confirmed to represent the left nipple.

## 2016-09-12 NOTE — Progress Notes (Signed)
Pre visit review using our clinic review tool, if applicable. No additional management support is needed unless otherwise documented below in the visit note.  Chief Complaint  Patient presents with  . Follow-up    HPI: David Copeland 55 y.o.  Fu of hyperglycemia   a1c ad inc to 6.7  Not  Better at taking pm dose of metformin  Nose unless some etoh some tingling?    exercising and losing weigh tin a healthy manner . Feels fine and bp is good  ROS: See pertinent positives and negatives per HPI.  No cv sx   Past Medical History:  Diagnosis Date  . History of diverticulitis of colon 8/05  . Hyperglycemia   . Hyperlipidemia   . Hypertension    off med after lifestyle intervention contolled    Family History  Problem Relation Age of Onset  . Stroke Father     in hot weather  . Breast cancer Mother   . Stroke Other   . Arthritis Neg Hx     Social History   Social History  . Marital status: Married    Spouse name: N/A  . Number of children: N/A  . Years of education: N/A   Social History Main Topics  . Smoking status: Never Smoker  . Smokeless tobacco: None  . Alcohol use Yes     Comment: occasional  . Drug use: No  . Sexual activity: Not Asked   Other Topics Concern  . None   Social History Narrative   Married with children   hhof 5   2 dogs    Former smoker   Regular exercise-yes  does marathon    [place]   No est. Runs   Sleep adequate   40- 50 jours per week.                   Outpatient Medications Prior to Visit  Medication Sig Dispense Refill  . aspirin 81 MG tablet Take 81 mg by mouth daily.      Marland Kitchen lisinopril (PRINIVIL,ZESTRIL) 5 MG tablet TAKE 1 TABLET DAILY 90 tablet 0  . metFORMIN (GLUCOPHAGE) 500 MG tablet TAKE 1 TABLET THREE TIMES A DAY BEFORE MEALS (Patient taking differently: TAKE 2 TABLETS IN THE AM AND 1 TABLET IN THE PM) 270 tablet 0  . simvastatin (ZOCOR) 40 MG tablet TAKE 1 TABLET AT BEDTIME 90 tablet 0  . metFORMIN  (GLUCOPHAGE) 500 MG tablet 3 (three) times daily before meals. 2 tabs q am and 1 q hs.     No facility-administered medications prior to visit.      EXAM:  BP 118/76 (BP Location: Right Arm, Patient Position: Sitting, Cuff Size: Large)   Temp 98 F (36.7 C) (Oral)   Wt 200 lb (90.7 kg)   BMI 27.89 kg/m   Body mass index is 27.89 kg/m.  GENERAL: vitals reviewed and listed above, alert, oriented, appears well hydrated and in no acute distress HEENT: atraumatic, conjunctiva  clear, no obvious abnormalities on inspection of external nose and ears MS: moves all extremities without noticeable focal  abnormality PSYCH: pleasant and cooperative, no obvious depression or anxiety Lab Results  Component Value Date   WBC 6.0 05/08/2016   HGB 14.2 05/08/2016   HCT 42.0 05/08/2016   PLT 180.0 05/08/2016   GLUCOSE 136 (H) 05/08/2016   CHOL 189 05/08/2016   TRIG 81.0 05/08/2016   HDL 59.10 05/08/2016   LDLCALC 114 (H) 05/08/2016  ALT 22 05/08/2016   AST 19 05/08/2016   NA 140 05/08/2016   K 4.5 05/08/2016   CL 105 05/08/2016   CREATININE 0.99 05/08/2016   BUN 15 05/08/2016   CO2 26 05/08/2016   TSH 1.71 05/08/2016   PSA 1.27 05/08/2016   HGBA1C 6.0 09/13/2016   MICROALBUR 0.3 11/23/2013   Wt Readings from Last 3 Encounters:  09/13/16 200 lb (90.7 kg)  05/15/16 206 lb (93.4 kg)  04/11/16 207 lb 6.4 oz (94.1 kg)    ASSESSMENT AND PLAN:  Discussed the following assessment and plan:  Hyperglycemia - much improved with better adherance  and lse  continue  with excellent  ls - Plan: POCT A1C  Essential hypertension - controlled   Medication management   cpx with labs and a1c in  6 months  ( Patient thinks can do cpx in calendar year not dictate by dates ) -Patient advised to return or notify health care team  if symptoms worsen ,persist or new concerns arise.  Patient Instructions   Continue same regimed doing well  Wt Readings from Last 3 Encounters:  09/13/16 200 lb  (90.7 kg)  05/15/16 206 lb (93.4 kg)  04/11/16 207 lb 6.4 oz (94.1 kg)    Lab Results  Component Value Date   WBC 6.0 05/08/2016   HGB 14.2 05/08/2016   HCT 42.0 05/08/2016   PLT 180.0 05/08/2016   GLUCOSE 136 (H) 05/08/2016   CHOL 189 05/08/2016   TRIG 81.0 05/08/2016   HDL 59.10 05/08/2016   LDLCALC 114 (H) 05/08/2016   ALT 22 05/08/2016   AST 19 05/08/2016   NA 140 05/08/2016   K 4.5 05/08/2016   CL 105 05/08/2016   CREATININE 0.99 05/08/2016   BUN 15 05/08/2016   CO2 26 05/08/2016   TSH 1.71 05/08/2016   PSA 1.27 05/08/2016   HGBA1C 6.0 09/13/2016   MICROALBUR 0.3 11/23/2013       Cindie Rajagopalan K. Amee Boothe M.D.

## 2016-09-13 ENCOUNTER — Encounter: Payer: Self-pay | Admitting: Internal Medicine

## 2016-09-13 ENCOUNTER — Ambulatory Visit (INDEPENDENT_AMBULATORY_CARE_PROVIDER_SITE_OTHER): Payer: BLUE CROSS/BLUE SHIELD | Admitting: Internal Medicine

## 2016-09-13 VITALS — BP 118/76 | Temp 98.0°F | Wt 200.0 lb

## 2016-09-13 DIAGNOSIS — I10 Essential (primary) hypertension: Secondary | ICD-10-CM | POA: Diagnosis not present

## 2016-09-13 DIAGNOSIS — Z79899 Other long term (current) drug therapy: Secondary | ICD-10-CM | POA: Diagnosis not present

## 2016-09-13 DIAGNOSIS — R739 Hyperglycemia, unspecified: Secondary | ICD-10-CM | POA: Diagnosis not present

## 2016-09-13 LAB — POCT GLYCOSYLATED HEMOGLOBIN (HGB A1C): HEMOGLOBIN A1C: 6

## 2016-09-13 NOTE — Patient Instructions (Signed)
Continue same regimed doing well  Wt Readings from Last 3 Encounters:  09/13/16 200 lb (90.7 kg)  05/15/16 206 lb (93.4 kg)  04/11/16 207 lb 6.4 oz (94.1 kg)    Lab Results  Component Value Date   WBC 6.0 05/08/2016   HGB 14.2 05/08/2016   HCT 42.0 05/08/2016   PLT 180.0 05/08/2016   GLUCOSE 136 (H) 05/08/2016   CHOL 189 05/08/2016   TRIG 81.0 05/08/2016   HDL 59.10 05/08/2016   LDLCALC 114 (H) 05/08/2016   ALT 22 05/08/2016   AST 19 05/08/2016   NA 140 05/08/2016   K 4.5 05/08/2016   CL 105 05/08/2016   CREATININE 0.99 05/08/2016   BUN 15 05/08/2016   CO2 26 05/08/2016   TSH 1.71 05/08/2016   PSA 1.27 05/08/2016   HGBA1C 6.0 09/13/2016   MICROALBUR 0.3 11/23/2013

## 2016-10-27 ENCOUNTER — Other Ambulatory Visit: Payer: Self-pay | Admitting: Internal Medicine

## 2016-10-30 NOTE — Telephone Encounter (Signed)
Sent to the pharmacy by e-scribe for 6 months.  Pt gets a 90 day supply.  Has upcoming follow up on 03/14/17.

## 2017-03-13 NOTE — Progress Notes (Signed)
Chief Complaint  Patient presents with  . Follow-up    HPI: Patient  Author Hatlestad Belsito  56 y.o. comes in today for  Chronic disease management  Now trying to take the metformin 3 times a day to in the morning 1 in the evening needs doing much better on that doesn't check his blood sugars lost machine although has some stress. Continues to do healthy exercise and eating. States his blood pressure was in the 120/70 range.  Health Maintenance  Topic Date Due  . OPHTHALMOLOGY EXAM  10/24/1973  . PNEUMOCOCCAL POLYSACCHARIDE VACCINE (2) 06/24/2010  . COLONOSCOPY  10/24/2013  . HEMOGLOBIN A1C  03/13/2017  . INFLUENZA VACCINE  05/29/2017 (Originally 07/30/2016)  . HIV Screening  09/13/2017 (Originally 10/24/1978)  . FOOT EXAM  05/15/2017  . TETANUS/TDAP  05/16/2019  . Hepatitis C Screening  Completed    ROS:  GEN/ HEENT: No fever, significant weight changes sweats headaches vision problems hearing changes, CV/ PULM; No chest pain shortness of breath cough, syncope,edema  change in exercise tolerance. GI /GU: No adominal pain, vomiting, change in bowel habits. No blood in the stool. No significant GU symptoms. SKIN/HEME: ,no acute skin rashes suspicious lesions or bleeding. No lymphadenopathy, nodules, masses.  NEURO/ PSYCH:  No neurologic signs such as weakness numbness. No depression anxiety. IMM/ Allergy: No unusual infections.  Allergy .   REST of 12 system review negative except as per HPI   Past Medical History:  Diagnosis Date  . History of diverticulitis of colon 8/05  . Hyperglycemia   . Hyperlipidemia   . Hypertension    off med after lifestyle intervention contolled    Past Surgical History:  Procedure Laterality Date  . ADENOIDECTOMY    . removed breast glands     damaged secondary to soccer  . TONSILLECTOMY    . WISDOM TOOTH EXTRACTION      Family History  Problem Relation Age of Onset  . Stroke Father     in hot weather  . Breast cancer Mother   .  Stroke Other   . Arthritis Neg Hx     Social History   Social History  . Marital status: Married    Spouse name: N/A  . Number of children: N/A  . Years of education: N/A   Social History Main Topics  . Smoking status: Never Smoker  . Smokeless tobacco: Never Used  . Alcohol use Yes     Comment: occasional  . Drug use: No  . Sexual activity: Not Asked   Other Topics Concern  . None   Social History Narrative   Married with children   hhof 5   2 dogs    Former smoker   Regular exercise-yes  does marathon    Is [place]   No est. Runs   Sleep adequate   40- 50 jours per week.                   Outpatient Medications Prior to Visit  Medication Sig Dispense Refill  . aspirin 81 MG tablet Take 81 mg by mouth daily.      Marland Kitchen lisinopril (PRINIVIL,ZESTRIL) 5 MG tablet TAKE 1 TABLET DAILY 90 tablet 1  . metFORMIN (GLUCOPHAGE) 500 MG tablet TAKE 1 TABLET THREE TIMES A DAY BEFORE MEALS 270 tablet 1  . simvastatin (ZOCOR) 40 MG tablet TAKE 1 TABLET AT BEDTIME 90 tablet 1   No facility-administered medications prior to visit.  EXAM:  BP 140/70 (BP Location: Left Arm, Patient Position: Sitting, Cuff Size: Normal)   Pulse 71   Temp 97.6 F (36.4 C) (Oral)   Ht 5\' 11"  (1.803 m)   Wt 205 lb 9.6 oz (93.3 kg)   BMI 28.68 kg/m   Body mass index is 28.68 kg/m. Wt Readings from Last 3 Encounters:  03/14/17 205 lb 9.6 oz (93.3 kg)  09/13/16 200 lb (90.7 kg)  05/15/16 206 lb (93.4 kg)    Physical Exam: Vital signs reviewed [id number] is a well-developed well-nourished alert cooperative    who appearsr stated age in no acute distress.  HEENT: normocephalic atraumatic CV: PMI is nondisplaced, S1 S2 no gallops, murmurs, rubs. Peripheral pulses are full without delay.Repeat blood pressure is 140/70. Right arm    Lab Results  Component Value Date   WBC 6.0 05/08/2016   HGB 14.2 05/08/2016   HCT 42.0 05/08/2016   PLT 180.0 05/08/2016   GLUCOSE 136 (H)  05/08/2016   CHOL 189 05/08/2016   TRIG 81.0 05/08/2016   HDL 59.10 05/08/2016   LDLCALC 114 (H) 05/08/2016   ALT 22 05/08/2016   AST 19 05/08/2016   NA 140 05/08/2016   K 4.5 05/08/2016   CL 105 05/08/2016   CREATININE 0.99 05/08/2016   BUN 15 05/08/2016   CO2 26 05/08/2016   TSH 1.71 05/08/2016   PSA 1.27 05/08/2016   HGBA1C 6.2 03/14/2017   MICROALBUR 0.3 11/23/2013    BP Readings from Last 3 Encounters:  03/14/17 140/70  09/13/16 118/76  05/15/16 (!) 151/91   Wt Readings from Last 3 Encounters:  03/14/17 205 lb 9.6 oz (93.3 kg)  09/13/16 200 lb (90.7 kg)  05/15/16 206 lb (93.4 kg)    Lab results reviewed with patient   ASSESSMENT AND PLAN:  Discussed the following assessment and plan:  Hyperglycemia - Plan: POC HgB A1c  Essential hypertension  Medication management psa discussed  Can do q o year no sx  Patient Care Team: Burnis Medin, MD as PCP - General Juanita Craver, MD as Consulting Physician (Gastroenterology) Patient Instructions  Continue lifestyle intervention healthy eating and exercise .  Take blood pressure readings twice a day for 7- 10 days and then periodically .To ensure below 140/90 preferably below 130 /85   .  Can send in readings    .   You are due for    Blood work chemistry cholesterol  In MAY June   Plan CPX with labs pre-visit in June to include lipid CMP          Standley Brooking. Panosh M.D.

## 2017-03-14 ENCOUNTER — Encounter: Payer: Self-pay | Admitting: Internal Medicine

## 2017-03-14 ENCOUNTER — Ambulatory Visit (INDEPENDENT_AMBULATORY_CARE_PROVIDER_SITE_OTHER): Payer: BLUE CROSS/BLUE SHIELD | Admitting: Internal Medicine

## 2017-03-14 VITALS — BP 140/70 | HR 71 | Temp 97.6°F | Ht 71.0 in | Wt 205.6 lb

## 2017-03-14 DIAGNOSIS — I10 Essential (primary) hypertension: Secondary | ICD-10-CM

## 2017-03-14 DIAGNOSIS — R739 Hyperglycemia, unspecified: Secondary | ICD-10-CM

## 2017-03-14 DIAGNOSIS — Z79899 Other long term (current) drug therapy: Secondary | ICD-10-CM | POA: Diagnosis not present

## 2017-03-14 LAB — POCT GLYCOSYLATED HEMOGLOBIN (HGB A1C): Hemoglobin A1C: 6.2

## 2017-03-14 NOTE — Patient Instructions (Addendum)
Continue lifestyle intervention healthy eating and exercise .  Take blood pressure readings twice a day for 7- 10 days and then periodically .To ensure below 140/90 preferably below 130 /85   .  Can send in readings    .   You are due for    Blood work chemistry cholesterol  In MAY June   Plan CPX with labs pre-visit in June to include lipid CMP

## 2017-06-01 ENCOUNTER — Other Ambulatory Visit: Payer: Self-pay | Admitting: Internal Medicine

## 2017-06-24 ENCOUNTER — Encounter: Payer: BLUE CROSS/BLUE SHIELD | Admitting: Internal Medicine

## 2017-07-29 NOTE — Progress Notes (Signed)
Chief Complaint  Patient presents with  . Annual Exam    HPI: Patient  David Copeland  56 y.o. comes in today for Preventive Health Care visit  And Chronic disease management  Meds   BG not checking  But ok  Taking 3 metformin 1500 per day  No numbness  Sx  Bp checking    Seems to be good  123/80 range no se of med Simvastatin .  No se of med  Never had eye exam for years  Due fo colon dr Collene Mares put off cause of wifes breast cancer  Had colon 15 years ago from abd painand was normal   Health Maintenance  Topic Date Due  . OPHTHALMOLOGY EXAM  10/24/1973  . PNEUMOCOCCAL POLYSACCHARIDE VACCINE (2) 06/24/2010  . COLONOSCOPY  10/24/2013  . FOOT EXAM  05/15/2017  . INFLUENZA VACCINE  07/30/2017  . HIV Screening  09/13/2017 (Originally 10/24/1978)  . HEMOGLOBIN A1C  09/14/2017  . TETANUS/TDAP  05/16/2019  . Hepatitis C Screening  Completed   Health Maintenance Review LIFESTYLE:  Exercise:   Running 30 miles per week  .  Tobacco/ETS: no Alcohol:   Couple a week  Sugar beverages: no Sleep: 7  Hours     Drug use: no HH of  5   2 pets   Work:50  Per week.     ROS:  GEN/ HEENT: No fever, significant weight changes sweats headaches vision problems hearing changes, CV/ PULM; No chest pain shortness of breath cough, syncope,edema  change in exercise tolerance. GI /GU: No adominal pain, vomiting, change in bowel habits. No blood in the stool. No significant GU symptoms. SKIN/HEME: ,no acute skin rashes suspicious lesions or bleeding. No lymphadenopathy, nodules, masses.  NEURO/ PSYCH:  No neurologic signs such as weakness numbness. No depression anxiety. IMM/ Allergy: No unusual infections.  Allergy .   REST of 12 system review negative except as per HPI   Past Medical History:  Diagnosis Date  . History of diverticulitis of colon 8/05  . Hyperglycemia   . Hyperlipidemia   . Hypertension    off med after lifestyle intervention contolled    Past Surgical History:    Procedure Laterality Date  . ADENOIDECTOMY    . removed breast glands     damaged secondary to soccer  . TONSILLECTOMY    . WISDOM TOOTH EXTRACTION      Family History  Problem Relation Age of Onset  . Stroke Father        in hot weather  . Breast cancer Mother   . Stroke Other   . Arthritis Neg Hx     Social History   Social History  . Marital status: Married    Spouse name: N/A  . Number of children: N/A  . Years of education: N/A   Social History Main Topics  . Smoking status: Never Smoker  . Smokeless tobacco: Never Used  . Alcohol use Yes     Comment: occasional  . Drug use: No  . Sexual activity: Not Asked   Other Topics Concern  . None   Social History Narrative   Married with children   hhof 5   2 dogs    Former smoker   Regular exercise-yes  does marathon    [place]   No est. Runs   Sleep adequate   40- 50 jours per week.  Outpatient Medications Prior to Visit  Medication Sig Dispense Refill  . aspirin 81 MG tablet Take 81 mg by mouth daily.      Marland Kitchen lisinopril (PRINIVIL,ZESTRIL) 5 MG tablet TAKE 1 TABLET DAILY 90 tablet 1  . metFORMIN (GLUCOPHAGE) 500 MG tablet TAKE 1 TABLET THREE TIMES A DAY BEFORE MEALS 270 tablet 1  . simvastatin (ZOCOR) 40 MG tablet TAKE 1 TABLET AT BEDTIME 90 tablet 1   No facility-administered medications prior to visit.      EXAM:  BP 130/80 (BP Location: Right Arm, Patient Position: Sitting, Cuff Size: Normal)   Pulse 78   Temp 98 F (36.7 C) (Oral)   Ht 5' 10.25" (1.784 m)   Wt 203 lb (92.1 kg)   BMI 28.92 kg/m   Body mass index is 28.92 kg/m. Wt Readings from Last 3 Encounters:  07/30/17 203 lb (92.1 kg)  03/14/17 205 lb 9.6 oz (93.3 kg)  09/13/16 200 lb (90.7 kg)    Physical Exam: Vital signs reviewed UXN:ATFT is a well-developed well-nourished alert cooperative    who appearsr stated age in no acute distress.  HEENT: normocephalic atraumatic , Eyes: PERRL EOM's  full, conjunctiva clear, Nares: paten,t no deformity discharge or tenderness., Ears: no deformity EAC's clear TMs with normal landmarks. Mouth: clear OP, no lesions, edema.  Moist mucous membranes. Dentition in adequate repair. NECK: supple without masses, thyromegaly or bruits. CHEST/PULM:  Clear to auscultation and percussion breath sounds equal no wheeze , rales or rhonchi. No chest wall deformities or tenderness. Breast: normal by inspection . No dimpling, discharge, masses, tenderness or discharge . CV: PMI is nondisplaced, S1 S2 no gallops, murmurs, rubs. Peripheral pulses are full without delay.No JVD .  ABDOMEN: Bowel sounds normal nontender  No guard or rebound, no hepato splenomegal no CVA tenderness.  No hernia. Extremtities:  No clubbing cyanosis or edema, no acute joint swelling or redness no focal atrophy NEURO:  Oriented x3, cranial nerves 3-12 appear to be intact, no obvious focal weakness,gait within normal limits no abnormal reflexes or asymmetrical SKIN: No acute rashes normal turgor, color, no bruising or petechiae.  toen nails thickening   otherwise nl feet  PSYCH: Oriented, good eye contact, no obvious depression anxiety, cognition and judgment appear normal. LN: no cervical axillary inguinal adenopathy  Lab Results  Component Value Date   WBC 6.0 05/08/2016   HGB 14.2 05/08/2016   HCT 42.0 05/08/2016   PLT 180.0 05/08/2016   GLUCOSE 136 (H) 05/08/2016   CHOL 189 05/08/2016   TRIG 81.0 05/08/2016   HDL 59.10 05/08/2016   LDLCALC 114 (H) 05/08/2016   ALT 22 05/08/2016   AST 19 05/08/2016   NA 140 05/08/2016   K 4.5 05/08/2016   CL 105 05/08/2016   CREATININE 0.99 05/08/2016   BUN 15 05/08/2016   CO2 26 05/08/2016   TSH 1.71 05/08/2016   PSA 1.27 05/08/2016   HGBA1C 6.2 03/14/2017   MICROALBUR 0.3 11/23/2013    BP Readings from Last 3 Encounters:  07/30/17 130/80  03/14/17 140/70  09/13/16 118/76  bp readings reviewed   ASSESSMENT AND PLAN:  Discussed  the following assessment and plan:  Encounter for preventive health examination - Plan: Basic metabolic panel, CBC with Differential/Platelet, Hemoglobin A1c, Hepatic function panel, Lipid panel  Essential hypertension - Plan: Basic metabolic panel, CBC with Differential/Platelet, Hemoglobin A1c, Hepatic function panel, Lipid panel  Impaired fasting glucose - Plan: Basic metabolic panel, CBC with Differential/Platelet, Hemoglobin A1c, Hepatic function panel, Lipid  panel  Medication management - Plan: Basic metabolic panel, CBC with Differential/Platelet, Hemoglobin A1c, Hepatic function panel, Lipid panel  Hyperlipidemia, unspecified hyperlipidemia type - Plan: Basic metabolic panel, Hepatic function panel, Lipid panel  Screening for prostate cancer - Plan: PSA Lab pending    ro v 6 mos depending on lab a1c at that visit  Shared Decision Making PSA ordered no high risk hx  Patient Care Team: Anastasios Melander, Standley Brooking, MD as PCP - General Juanita Craver, MD as Consulting Physician (Gastroenterology) Patient Instructions  Get an eye exam.   Colon cancer screening  .   Contact dr Collene Mares office  To arrange let us know if need help  With this.   Continue lifestyle intervention healthy eating and exercise .  And medications .  Will notify you  of labs when available.   If all ok then ROV in 6 months or as needed .   Preventive Care 40-64 Years, Male Preventive care refers to lifestyle choices and visits with your health care provider that can promote health and wellness. What does preventive care include?  A yearly physical exam. This is also called an annual well check.  Dental exams once or twice a year.  Routine eye exams. Ask your health care provider how often you should have your eyes checked.  Personal lifestyle choices, including: ? Daily care of your teeth and gums. ? Regular physical activity. ? Eating a healthy diet. ? Avoiding tobacco and drug use. ? Limiting alcohol  use. ? Practicing safe sex. ? Taking low-dose aspirin every day starting at age 39. What happens during an annual well check? The services and screenings done by your health care provider during your annual well check will depend on your age, overall health, lifestyle risk factors, and family history of disease. Counseling Your health care provider may ask you questions about your:  Alcohol use.  Tobacco use.  Drug use.  Emotional well-being.  Home and relationship well-being.  Sexual activity.  Eating habits.  Work and work Statistician.  Screening You may have the following tests or measurements:  Height, weight, and BMI.  Blood pressure.  Lipid and cholesterol levels. These may be checked every 5 years, or more frequently if you are over 58 years old.  Skin check.  Lung cancer screening. You may have this screening every year starting at age 28 if you have a 30-pack-year history of smoking and currently smoke or have quit within the past 15 years.  Fecal occult blood test (FOBT) of the stool. You may have this test every year starting at age 65.  Flexible sigmoidoscopy or colonoscopy. You may have a sigmoidoscopy every 5 years or a colonoscopy every 10 years starting at age 64.  Prostate cancer screening. Recommendations will vary depending on your family history and other risks.  Hepatitis C blood test.  Hepatitis B blood test.  Sexually transmitted disease (STD) testing.  Diabetes screening. This is done by checking your blood sugar (glucose) after you have not eaten for a while (fasting). You may have this done every 1-3 years.  Discuss your test results, treatment options, and if necessary, the need for more tests with your health care provider. Vaccines Your health care provider may recommend certain vaccines, such as:  Influenza vaccine. This is recommended every year.  Tetanus, diphtheria, and acellular pertussis (Tdap, Td) vaccine. You may need a Td  booster every 10 years.  Varicella vaccine. You may need this if you have not been vaccinated.  Zoster vaccine. You may need this after age 12.  Measles, mumps, and rubella (MMR) vaccine. You may need at least one dose of MMR if you were born in 1957 or later. You may also need a second dose.  Pneumococcal 13-valent conjugate (PCV13) vaccine. You may need this if you have certain conditions and have not been vaccinated.  Pneumococcal polysaccharide (PPSV23) vaccine. You may need one or two doses if you smoke cigarettes or if you have certain conditions.  Meningococcal vaccine. You may need this if you have certain conditions.  Hepatitis A vaccine. You may need this if you have certain conditions or if you travel or work in places where you may be exposed to hepatitis A.  Hepatitis B vaccine. You may need this if you have certain conditions or if you travel or work in places where you may be exposed to hepatitis B.  Haemophilus influenzae type b (Hib) vaccine. You may need this if you have certain risk factors.  Talk to your health care provider about which screenings and vaccines you need and how often you need them. This information is not intended to replace advice given to you by your health care provider. Make sure you discuss any questions you have with your health care provider. Document Released: 01/12/2016 Document Revised: 09/04/2016 Document Reviewed: 10/17/2015 Elsevier Interactive Patient Education  2017 [place]. Attallah Ontko M.D.

## 2017-07-30 ENCOUNTER — Ambulatory Visit (INDEPENDENT_AMBULATORY_CARE_PROVIDER_SITE_OTHER): Payer: BLUE CROSS/BLUE SHIELD | Admitting: Internal Medicine

## 2017-07-30 ENCOUNTER — Encounter: Payer: Self-pay | Admitting: Internal Medicine

## 2017-07-30 VITALS — BP 130/80 | HR 78 | Temp 98.0°F | Ht 70.25 in | Wt 203.0 lb

## 2017-07-30 DIAGNOSIS — E785 Hyperlipidemia, unspecified: Secondary | ICD-10-CM

## 2017-07-30 DIAGNOSIS — I10 Essential (primary) hypertension: Secondary | ICD-10-CM

## 2017-07-30 DIAGNOSIS — Z79899 Other long term (current) drug therapy: Secondary | ICD-10-CM | POA: Diagnosis not present

## 2017-07-30 DIAGNOSIS — R7301 Impaired fasting glucose: Secondary | ICD-10-CM

## 2017-07-30 DIAGNOSIS — Z0000 Encounter for general adult medical examination without abnormal findings: Secondary | ICD-10-CM | POA: Diagnosis not present

## 2017-07-30 DIAGNOSIS — Z125 Encounter for screening for malignant neoplasm of prostate: Secondary | ICD-10-CM | POA: Diagnosis not present

## 2017-07-30 LAB — HEPATIC FUNCTION PANEL
ALT: 18 U/L (ref 0–53)
AST: 20 U/L (ref 0–37)
Albumin: 4.8 g/dL (ref 3.5–5.2)
Alkaline Phosphatase: 50 U/L (ref 39–117)
BILIRUBIN TOTAL: 0.9 mg/dL (ref 0.2–1.2)
Bilirubin, Direct: 0.1 mg/dL (ref 0.0–0.3)
Total Protein: 7.3 g/dL (ref 6.0–8.3)

## 2017-07-30 LAB — CBC WITH DIFFERENTIAL/PLATELET
BASOS PCT: 0.4 % (ref 0.0–3.0)
Basophils Absolute: 0 10*3/uL (ref 0.0–0.1)
EOS ABS: 0 10*3/uL (ref 0.0–0.7)
Eosinophils Relative: 0.6 % (ref 0.0–5.0)
HEMATOCRIT: 45.4 % (ref 39.0–52.0)
Hemoglobin: 15.1 g/dL (ref 13.0–17.0)
LYMPHS ABS: 1.7 10*3/uL (ref 0.7–4.0)
Lymphocytes Relative: 21.7 % (ref 12.0–46.0)
MCHC: 33.3 g/dL (ref 30.0–36.0)
MCV: 90 fl (ref 78.0–100.0)
MONO ABS: 0.4 10*3/uL (ref 0.1–1.0)
Monocytes Relative: 5.1 % (ref 3.0–12.0)
NEUTROS ABS: 5.6 10*3/uL (ref 1.4–7.7)
NEUTROS PCT: 72.2 % (ref 43.0–77.0)
PLATELETS: 208 10*3/uL (ref 150.0–400.0)
RBC: 5.04 Mil/uL (ref 4.22–5.81)
RDW: 13.3 % (ref 11.5–15.5)
WBC: 7.8 10*3/uL (ref 4.0–10.5)

## 2017-07-30 LAB — LIPID PANEL
CHOLESTEROL: 197 mg/dL (ref 0–200)
HDL: 58.6 mg/dL (ref >39.00)
LDL CALC: 116 mg/dL — AB (ref 0–99)
NonHDL: 138.31
TRIGLYCERIDES: 111 mg/dL (ref 0.0–149.0)
Total CHOL/HDL Ratio: 3
VLDL: 22.2 mg/dL (ref 0.0–40.0)

## 2017-07-30 LAB — BASIC METABOLIC PANEL
BUN: 17 mg/dL (ref 6–23)
CALCIUM: 9.7 mg/dL (ref 8.4–10.5)
CHLORIDE: 102 meq/L (ref 96–112)
CO2: 29 meq/L (ref 19–32)
CREATININE: 1 mg/dL (ref 0.40–1.50)
GFR: 82.84 mL/min (ref >60.00)
GLUCOSE: 115 mg/dL — AB (ref 70–99)
Potassium: 4.1 mEq/L (ref 3.5–5.1)
Sodium: 138 mEq/L (ref 135–145)

## 2017-07-30 LAB — HEMOGLOBIN A1C: Hgb A1c MFr Bld: 6.6 % — ABNORMAL HIGH (ref 4.6–6.5)

## 2017-07-30 LAB — PSA: PSA: 1.66 ng/mL (ref 0.10–4.00)

## 2017-07-30 NOTE — Patient Instructions (Addendum)
Get an eye exam.   Colon cancer screening  .   Contact dr Collene Mares office  To arrange let us know if need help  With this.   Continue lifestyle intervention healthy eating and exercise .  And medications .  Will notify you  of labs when available.   If all ok then ROV in 6 months or as needed .   Preventive Care 40-64 Years, Male Preventive care refers to lifestyle choices and visits with your health care provider that can promote health and wellness. What does preventive care include?  A yearly physical exam. This is also called an annual well check.  Dental exams once or twice a year.  Routine eye exams. Ask your health care provider how often you should have your eyes checked.  Personal lifestyle choices, including: ? Daily care of your teeth and gums. ? Regular physical activity. ? Eating a healthy diet. ? Avoiding tobacco and drug use. ? Limiting alcohol use. ? Practicing safe sex. ? Taking low-dose aspirin every day starting at age 61. What happens during an annual well check? The services and screenings done by your health care provider during your annual well check will depend on your age, overall health, lifestyle risk factors, and family history of disease. Counseling Your health care provider may ask you questions about your:  Alcohol use.  Tobacco use.  Drug use.  Emotional well-being.  Home and relationship well-being.  Sexual activity.  Eating habits.  Work and work Statistician.  Screening You may have the following tests or measurements:  Height, weight, and BMI.  Blood pressure.  Lipid and cholesterol levels. These may be checked every 5 years, or more frequently if you are over 61 years old.  Skin check.  Lung cancer screening. You may have this screening every year starting at age 63 if you have a 30-pack-year history of smoking and currently smoke or have quit within the past 15 years.  Fecal occult blood test (FOBT) of the stool. You may  have this test every year starting at age 46.  Flexible sigmoidoscopy or colonoscopy. You may have a sigmoidoscopy every 5 years or a colonoscopy every 10 years starting at age 8.  Prostate cancer screening. Recommendations will vary depending on your family history and other risks.  Hepatitis C blood test.  Hepatitis B blood test.  Sexually transmitted disease (STD) testing.  Diabetes screening. This is done by checking your blood sugar (glucose) after you have not eaten for a while (fasting). You may have this done every 1-3 years.  Discuss your test results, treatment options, and if necessary, the need for more tests with your health care provider. Vaccines Your health care provider may recommend certain vaccines, such as:  Influenza vaccine. This is recommended every year.  Tetanus, diphtheria, and acellular pertussis (Tdap, Td) vaccine. You may need a Td booster every 10 years.  Varicella vaccine. You may need this if you have not been vaccinated.  Zoster vaccine. You may need this after age 72.  Measles, mumps, and rubella (MMR) vaccine. You may need at least one dose of MMR if you were born in 1957 or later. You may also need a second dose.  Pneumococcal 13-valent conjugate (PCV13) vaccine. You may need this if you have certain conditions and have not been vaccinated.  Pneumococcal polysaccharide (PPSV23) vaccine. You may need one or two doses if you smoke cigarettes or if you have certain conditions.  Meningococcal vaccine. You may need this if you  have certain conditions.  Hepatitis A vaccine. You may need this if you have certain conditions or if you travel or work in places where you may be exposed to hepatitis A.  Hepatitis B vaccine. You may need this if you have certain conditions or if you travel or work in places where you may be exposed to hepatitis B.  Haemophilus influenzae type b (Hib) vaccine. You may need this if you have certain risk factors.  Talk to  your health care provider about which screenings and vaccines you need and how often you need them. This information is not intended to replace advice given to you by your health care provider. Make sure you discuss any questions you have with your health care provider. Document Released: 01/12/2016 Document Revised: 09/04/2016 Document Reviewed: 10/17/2015 Elsevier Interactive Patient Education  2017 [employer].

## 2017-08-01 ENCOUNTER — Other Ambulatory Visit: Payer: Self-pay | Admitting: Emergency Medicine

## 2017-08-01 DIAGNOSIS — E785 Hyperlipidemia, unspecified: Secondary | ICD-10-CM

## 2017-08-01 MED ORDER — ROSUVASTATIN CALCIUM 10 MG PO TABS: 10.0000 mg | ORAL_TABLET | Freq: Every day | ORAL | 0 refills | Status: DC

## 2017-09-19 ENCOUNTER — Encounter: Payer: Self-pay | Admitting: Internal Medicine

## 2017-10-23 ENCOUNTER — Other Ambulatory Visit: Payer: Self-pay | Admitting: Internal Medicine

## 2017-11-04 ENCOUNTER — Other Ambulatory Visit (INDEPENDENT_AMBULATORY_CARE_PROVIDER_SITE_OTHER): Payer: BLUE CROSS/BLUE SHIELD

## 2017-11-04 DIAGNOSIS — E785 Hyperlipidemia, unspecified: Secondary | ICD-10-CM | POA: Diagnosis not present

## 2017-11-04 LAB — LIPID PANEL
CHOLESTEROL: 182 mg/dL (ref 0–200)
HDL: 60.2 mg/dL (ref >39.00)
LDL Cholesterol: 103 mg/dL — ABNORMAL HIGH (ref 0–99)
NonHDL: 122.09
Total CHOL/HDL Ratio: 3
Triglycerides: 97 mg/dL (ref 0.0–149.0)
VLDL: 19.4 mg/dL (ref 0.0–40.0)

## 2017-11-07 ENCOUNTER — Encounter: Payer: Self-pay | Admitting: Internal Medicine

## 2017-11-17 MED ORDER — ROSUVASTATIN CALCIUM 20 MG PO TABS: 10.0000 mg | ORAL_TABLET | Freq: Every day | ORAL | 0 refills | Status: DC

## 2017-12-23 ENCOUNTER — Other Ambulatory Visit: Payer: Self-pay | Admitting: Internal Medicine

## 2018-01-30 ENCOUNTER — Ambulatory Visit: Payer: BLUE CROSS/BLUE SHIELD | Admitting: Internal Medicine

## 2018-02-02 NOTE — Progress Notes (Signed)
Chief Complaint  Patient presents with  . Follow-up    Pt has been doing 10mg  Crestor since last OV and labs.     HPI: David Copeland 57 y.o. come in for Chronic disease management   On 10 mg crestor  (Not simva ) Tolerating well.  Had se atorva  No change otherwise cv neuro sx.  ROS: See pertinent positives and negatives per HPI.  Past Medical History:  Diagnosis Date  . History of diverticulitis of colon 8/05  . Hyperglycemia   . Hyperlipidemia   . Hypertension    off med after lifestyle intervention contolled    Family History  Problem Relation Age of Onset  . Stroke Father        in hot weather  . Breast cancer Mother   . Stroke Other   . Arthritis Neg Hx     Social History   Socioeconomic History  . Marital status: Married    Spouse name: None  . Number of children: None  . Years of education: None  . Highest education level: None  Social Needs  . Financial resource strain: None  . Food insecurity - worry: None  . Food insecurity - inability: None  . Transportation needs - medical: None  . Transportation needs - non-medical: None  Occupational History  . None  Tobacco Use  . Smoking status: Never Smoker  . Smokeless tobacco: Never Used  Substance and Sexual Activity  . Alcohol use: Yes    Comment: occasional  . Drug use: No  . Sexual activity: None  Other Topics Concern  . None  Social History Narrative   Married with children   hhof 5   2 dogs    Former smoker   Regular exercise-yes  does marathon    Is [place]   No est. Runs   Sleep adequate   40- 50 jours per week.                   Outpatient Medications Prior to Visit  Medication Sig Dispense Refill  . aspirin 81 MG tablet Take 81 mg by mouth daily.      Marland Kitchen lisinopril (PRINIVIL,ZESTRIL) 5 MG tablet TAKE 1 TABLET DAILY 90 tablet 1  . metFORMIN (GLUCOPHAGE) 500 MG tablet TAKE 1 TABLET THREE TIMES A DAY BEFORE MEALS 270 tablet 1  . rosuvastatin (CRESTOR) 20 MG  tablet Take 0.5 tablets (10 mg total) daily by mouth. 90 tablet 0  . simvastatin (ZOCOR) 40 MG tablet TAKE 1 TABLET AT BEDTIME (Patient not taking: Reported on 02/03/2018) 90 tablet 1   No facility-administered medications prior to visit.      EXAM:  BP 132/90 (BP Location: Left Arm, Patient Position: Sitting, Cuff Size: Normal)   Pulse 96   Temp 97.8 F (36.6 C) (Oral)   Wt 207 lb 6.4 oz (94.1 kg)   BMI 29.55 kg/m   Body mass index is 29.55 kg/m.  GENERAL: vitals reviewed and listed above, alert, oriented, appears well hydrated and in no acute distress PSYCH: pleasant and cooperative, no obvious depression or anxiety Lab Results  Component Value Date   WBC 7.8 07/30/2017   HGB 15.1 07/30/2017   HCT 45.4 07/30/2017   PLT 208.0 07/30/2017   GLUCOSE 115 (H) 07/30/2017   CHOL 182 11/04/2017   TRIG 97.0 11/04/2017   HDL 60.20 11/04/2017   LDLCALC 103 (H) 11/04/2017   ALT 18 07/30/2017   AST 20 07/30/2017   NA  138 07/30/2017   K 4.1 07/30/2017   CL 102 07/30/2017   CREATININE 1.00 07/30/2017   BUN 17 07/30/2017   CO2 29 07/30/2017   TSH 1.71 05/08/2016   PSA 1.66 07/30/2017   HGBA1C 6.5 02/03/2018   MICROALBUR 0.3 11/23/2013   BP Readings from Last 3 Encounters:  02/03/18 132/90  07/30/17 130/80  03/14/17 140/70   Reviewed labs   With pt  ASSESSMENT AND PLAN:  Discussed the following assessment and plan:  Hyperlipidemia, unspecified hyperlipidemia type - Plan: Basic metabolic panel, CBC with Differential/Platelet, Hemoglobin A1c, Hepatic function panel, PSA, Lipid panel  Medication management - Plan: Basic metabolic panel, CBC with Differential/Platelet, Hemoglobin A1c, Hepatic function panel, PSA, POC HgB A1c  Essential hypertension - Plan: Basic metabolic panel, CBC with Differential/Platelet, Hemoglobin A1c, Hepatic function panel, PSA  Impaired fasting glucose - a1c 6.5  - Plan: Basic metabolic panel, CBC with Differential/Platelet, Hemoglobin A1c, Hepatic  function panel, PSA, POC HgB A1c  Screening PSA (prostate specific antigen) - ordered for cpx in august  - Plan: PSA A 1c and clarify meds list trial of  20 mg crestor  .  -Patient advised to return or notify health care team  if  new concerns arise.  Patient Instructions  Increase crestor to 20 mg per day  And if tolerated continue .   Continue lifestyle intervention healthy eating and exercise .   cpx and labs  In august when due for yearly .  Can do labs ahead of time   If I  Place orders      Standley Brooking. Micco Bourbeau M.D.

## 2018-02-03 ENCOUNTER — Encounter: Payer: Self-pay | Admitting: Internal Medicine

## 2018-02-03 ENCOUNTER — Ambulatory Visit: Payer: BLUE CROSS/BLUE SHIELD | Admitting: Internal Medicine

## 2018-02-03 VITALS — BP 132/90 | HR 96 | Temp 97.8°F | Wt 207.4 lb

## 2018-02-03 DIAGNOSIS — E785 Hyperlipidemia, unspecified: Secondary | ICD-10-CM

## 2018-02-03 DIAGNOSIS — I10 Essential (primary) hypertension: Secondary | ICD-10-CM

## 2018-02-03 DIAGNOSIS — R7301 Impaired fasting glucose: Secondary | ICD-10-CM

## 2018-02-03 DIAGNOSIS — Z125 Encounter for screening for malignant neoplasm of prostate: Secondary | ICD-10-CM | POA: Diagnosis not present

## 2018-02-03 DIAGNOSIS — Z79899 Other long term (current) drug therapy: Secondary | ICD-10-CM

## 2018-02-03 LAB — POCT GLYCOSYLATED HEMOGLOBIN (HGB A1C): Hemoglobin A1C: 6.5

## 2018-02-03 MED ORDER — ROSUVASTATIN CALCIUM 20 MG PO TABS: 20.0000 mg | ORAL_TABLET | Freq: Every day | ORAL | 3 refills | Status: DC

## 2018-02-03 NOTE — Patient Instructions (Addendum)
Increase crestor to 20 mg per day  And if tolerated continue .   Continue lifestyle intervention healthy eating and exercise .   cpx and labs  In august when due for yearly .  Can do labs ahead of time   If I  Place orders

## 2018-07-02 ENCOUNTER — Other Ambulatory Visit: Payer: Self-pay | Admitting: Internal Medicine

## 2018-07-27 ENCOUNTER — Other Ambulatory Visit (INDEPENDENT_AMBULATORY_CARE_PROVIDER_SITE_OTHER): Payer: BLUE CROSS/BLUE SHIELD

## 2018-07-27 DIAGNOSIS — I10 Essential (primary) hypertension: Secondary | ICD-10-CM

## 2018-07-27 DIAGNOSIS — R7301 Impaired fasting glucose: Secondary | ICD-10-CM | POA: Diagnosis not present

## 2018-07-27 DIAGNOSIS — E785 Hyperlipidemia, unspecified: Secondary | ICD-10-CM | POA: Diagnosis not present

## 2018-07-27 DIAGNOSIS — Z79899 Other long term (current) drug therapy: Secondary | ICD-10-CM

## 2018-07-27 DIAGNOSIS — Z125 Encounter for screening for malignant neoplasm of prostate: Secondary | ICD-10-CM

## 2018-07-27 LAB — CBC WITH DIFFERENTIAL/PLATELET
BASOS PCT: 0.4 % (ref 0.0–3.0)
Basophils Absolute: 0 10*3/uL (ref 0.0–0.1)
EOS PCT: 2.6 % (ref 0.0–5.0)
Eosinophils Absolute: 0.1 10*3/uL (ref 0.0–0.7)
HCT: 41.8 % (ref 39.0–52.0)
HEMOGLOBIN: 14.4 g/dL (ref 13.0–17.0)
LYMPHS ABS: 2 10*3/uL (ref 0.7–4.0)
Lymphocytes Relative: 38 % (ref 12.0–46.0)
MCHC: 34.5 g/dL (ref 30.0–36.0)
MCV: 87.3 fl (ref 78.0–100.0)
MONO ABS: 0.3 10*3/uL (ref 0.1–1.0)
Monocytes Relative: 6.4 % (ref 3.0–12.0)
NEUTROS PCT: 52.6 % (ref 43.0–77.0)
Neutro Abs: 2.7 10*3/uL (ref 1.4–7.7)
Platelets: 172 10*3/uL (ref 150.0–400.0)
RBC: 4.79 Mil/uL (ref 4.22–5.81)
RDW: 13.5 % (ref 11.5–15.5)
WBC: 5.2 10*3/uL (ref 4.0–10.5)

## 2018-07-27 LAB — HEMOGLOBIN A1C: Hgb A1c MFr Bld: 7 % — ABNORMAL HIGH (ref 4.6–6.5)

## 2018-07-27 LAB — HEPATIC FUNCTION PANEL
ALT: 19 U/L (ref 0–53)
AST: 22 U/L (ref 0–37)
Albumin: 4.4 g/dL (ref 3.5–5.2)
Alkaline Phosphatase: 43 U/L (ref 39–117)
BILIRUBIN DIRECT: 0.1 mg/dL (ref 0.0–0.3)
BILIRUBIN TOTAL: 0.7 mg/dL (ref 0.2–1.2)
TOTAL PROTEIN: 7 g/dL (ref 6.0–8.3)

## 2018-07-27 LAB — BASIC METABOLIC PANEL
BUN: 18 mg/dL (ref 6–23)
CO2: 25 meq/L (ref 19–32)
Calcium: 9.4 mg/dL (ref 8.4–10.5)
Chloride: 104 mEq/L (ref 96–112)
Creatinine, Ser: 1.01 mg/dL (ref 0.40–1.50)
GFR: 81.59 mL/min (ref >60.00)
Glucose, Bld: 132 mg/dL — ABNORMAL HIGH (ref 70–99)
Potassium: 4.1 mEq/L (ref 3.5–5.1)
Sodium: 140 mEq/L (ref 135–145)

## 2018-07-27 LAB — LIPID PANEL
CHOLESTEROL: 161 mg/dL (ref 0–200)
HDL: 59.7 mg/dL (ref >39.00)
LDL Cholesterol: 82 mg/dL (ref 0–99)
NonHDL: 101.42
TRIGLYCERIDES: 97 mg/dL (ref 0.0–149.0)
Total CHOL/HDL Ratio: 3
VLDL: 19.4 mg/dL (ref 0.0–40.0)

## 2018-07-27 LAB — PSA: PSA: 1.36 ng/mL (ref 0.10–4.00)

## 2018-08-03 ENCOUNTER — Encounter: Payer: BLUE CROSS/BLUE SHIELD | Admitting: Internal Medicine

## 2018-08-11 NOTE — Progress Notes (Signed)
Chief Complaint  Patient presents with  . Annual Exam    No new concerns  . Medication Management  . Hyperlipidemia  . Hypertension    HPI: Patient  David Copeland  57 y.o. comes in today for Preventive Health Care visit  And med eval and condition  BG:  On metformin 1500 per day  conpliant nose  bp  Seems good when checks it  Lipid.  No se of higher dose of med  Continues to eat healthy and exercise  runns  Without sig change in toleracne but "getting older"  No numbness  Eye exam still voer due for  colon with dr Collene Mares  Health Maintenance  Topic Date Due  . OPHTHALMOLOGY EXAM  10/24/1973  . HIV Screening  10/24/1978  . COLONOSCOPY  10/24/2013  . FOOT EXAM  05/15/2017  . INFLUENZA VACCINE  07/30/2018  . HEMOGLOBIN A1C  01/27/2019  . TETANUS/TDAP  05/16/2019  . PNEUMOCOCCAL POLYSACCHARIDE VACCINE AGE 39-64 HIGH RISK  Completed  . Hepatitis C Screening  Completed   Health Maintenance Review LIFESTYLE:  Exercise:   Running 6 hours per week  Tobacco/ETS: no Alcohol:  Couple a week  Sugar beverages: sometimes  Sleep: 7  Drug use: no HH of  5  Work:  50   ROS:  GEN/ HEENT: No fever, significant weight changes sweats headaches vision problems hearing changes, CV/ PULM; No chest pain shortness of breath cough, syncope,edema  change in exercise tolerance. GI /GU: No adominal pain, vomiting, change in bowel habits. No blood in the stool. No significant GU symptoms. SKIN/HEME: ,no acute skin rashes suspicious lesions or bleeding. No lymphadenopathy, nodules, masses.  NEURO/ PSYCH:  No neurologic signs such as weakness numbness. No depression anxiety. IMM/ Allergy: No unusual infections.  Allergy .   REST of 12 system review negative except as per HPI   Past Medical History:  Diagnosis Date  . History of diverticulitis of colon 8/05  . Hyperglycemia   . Hyperlipidemia   . Hypertension    off med after lifestyle intervention contolled    Past Surgical History:    Procedure Laterality Date  . ADENOIDECTOMY    . removed breast glands     damaged secondary to soccer  . TONSILLECTOMY    . WISDOM TOOTH EXTRACTION      Family History  Problem Relation Age of Onset  . Stroke Father        in hot weather  . Breast cancer Mother   . Stroke Other   . Arthritis Neg Hx     Social History   Socioeconomic History  . Marital status: Married    Spouse name: Not on file  . Number of children: Not on file  . Years of education: Not on file  . Highest education level: Not on file  Occupational History  . Not on file  Social Needs  . Financial resource strain: Not on file  . Food insecurity:    Worry: Not on file    Inability: Not on file  . Transportation needs:    Medical: Not on file    Non-medical: Not on file  Tobacco Use  . Smoking status: Never Smoker  . Smokeless tobacco: Never Used  Substance and Sexual Activity  . Alcohol use: Yes    Comment: occasional  . Drug use: No  . Sexual activity: Not on file  Lifestyle  . Physical activity:    Days per week: Not on file  Minutes per session: Not on file  . Stress: Not on file  Relationships  . Social connections:    Talks on phone: Not on file    Gets together: Not on file    Attends religious service: Not on file    Active member of club or organization: Not on file    Attends meetings of clubs or organizations: Not on file    Relationship status: Not on file  Other Topics Concern  . Not on file  Social History Narrative   Married with children   hhof 5   2 dogs    Former smoker   Regular exercise-yes  does marathon    Is [place] as [place]   No est. Runs   Sleep adequate   40- 50 jours per week.                   Outpatient Medications Prior to Visit  Medication Sig Dispense Refill  . aspirin 81 MG tablet Take 81 mg by mouth daily.      Marland Kitchen lisinopril (PRINIVIL,ZESTRIL) 5 MG tablet TAKE 1 TABLET DAILY 90 tablet 1  . rosuvastatin (CRESTOR) 20 MG tablet Take 1  tablet (20 mg total) by mouth daily. 90 tablet 3  . metFORMIN (GLUCOPHAGE) 500 MG tablet TAKE 1 TABLET THREE TIMES A DAY BEFORE MEALS 270 tablet 1   No facility-administered medications prior to visit.      EXAM:  BP 138/82 (BP Location: Right Arm, Patient Position: Sitting, Cuff Size: Normal)   Pulse 68   Temp 97.9 F (36.6 C) (Oral)   Ht 5' 10.5" (1.791 m)   Wt 205 lb 1.6 oz (93 kg)   BMI 29.01 kg/m   Body mass index is 29.01 kg/m. Wt Readings from Last 3 Encounters:  08/12/18 205 lb 1.6 oz (93 kg)  02/03/18 207 lb 6.4 oz (94.1 kg)  07/30/17 203 lb (92.1 kg)    Physical Exam: Vital signs reviewed FAO:ZHYQ is a well-developed well-nourished alert cooperative    who appearsr stated age in no acute distress.  HEENT: normocephalic atraumatic , Eyes: PERRL EOM's full, conjunctiva clear, Nares: paten,t no deformity discharge or tenderness., Ears: no deformity EAC's clear TMs with normal landmarks. Mouth: clear OP, no lesions, edema.  Moist mucous membranes. Dentition in adequate repair. NECK: supple without masses, thyromegaly or bruits. CHEST/PULM:  Clear to auscultation and percussion breath sounds equal no wheeze , rales or rhonchi. No chest wall deformities or tenderness. Breast: normal by inspection . No dimpling, discharge, masses, tenderness or discharge . CV: PMI is nondisplaced, S1 S2 no gallops, murmurs, rubs. Peripheral pulses are full without delay.No JVD .  ABDOMEN: Bowel sounds normal nontender  No guard or rebound, no hepato splenomegal no CVA tenderness.  No hernia. Extremtities:  No clubbing cyanosis or edema, no acute joint swelling or redness no focal atrophy NEURO:  Oriented x3, cranial nerves 3-12 appear to be intact, no obvious focal weakness,gait within normal limits no abnormal reflexes or asymmetrical SKIN: No acute rashes normal turgor, color, no bruising or petechiae. PSYCH: Oriented, good eye contact, no obvious depression anxiety, cognition and judgment  appear normal. LN: no cervical axillary inguinal adenopathy Rectal exam  No masses  Noted  Diabetic Foot Exam - Simple   Simple Foot Form Diabetic Foot exam was performed with the following findings:  Yes 08/12/2018 12:15 PM  Visual Inspection No deformities, no ulcerations, no other skin breakdown bilaterally:  Yes Sensation Testing Intact to touch and  monofilament testing bilaterally:  Yes Pulse Check Posterior Tibialis and Dorsalis pulse intact bilaterally:  Yes Comments opnnychomycosis     Lab Results  Component Value Date   WBC 5.2 07/27/2018   HGB 14.4 07/27/2018   HCT 41.8 07/27/2018   PLT 172.0 07/27/2018   GLUCOSE 132 (H) 07/27/2018   CHOL 161 07/27/2018   TRIG 97.0 07/27/2018   HDL 59.70 07/27/2018   LDLCALC 82 07/27/2018   ALT 19 07/27/2018   AST 22 07/27/2018   NA 140 07/27/2018   K 4.1 07/27/2018   CL 104 07/27/2018   CREATININE 1.01 07/27/2018   BUN 18 07/27/2018   CO2 25 07/27/2018   TSH 1.71 05/08/2016   PSA 1.36 07/27/2018   HGBA1C 7.0 (H) 07/27/2018   MICROALBUR 0.3 11/23/2013    BP Readings from Last 3 Encounters:  08/12/18 138/82  02/03/18 132/90  07/30/17 130/80    Lab reveiw  ASSESSMENT AND PLAN:  Discussed the following assessment and plan:  Visit for preventive health examination  Medication management  Hyperlipidemia, unspecified hyperlipidemia type  Essential hypertension  Impaired fasting glucose  Diabetes mellitus without complication ([place]) H4V now in the diabetic range    Inc metformin to max 2000 mg per day  Disc add on options  Such as jardiance   He is good weight excellent exercise and pretty  Healthy diet    But understand bit of frustration   That number deteriorated   Poss aggravated by the  Inc Crestor dose?  But   benefit more than risk  ? colon cancer screening ? And eye exam  ? Hep vaccine may be utd on this  Patient Care Team: Onesimo Lingard, Standley Brooking, MD as PCP - Elliot Gault, MD as Consulting Physician  (Gastroenterology) Patient Instructions      a1c now in diabetic range   Inc metformin to maximum  lipids are better at goal continue Increase metformin to 4 per day  Get eye exam and   Colonoscopy.   PLan rov in  4-6 months or as needed   Health Maintenance Plans   COLONOSCOPY EVERY 10 YEARS  DIABETES MELLITUS FOOT EXAM yearly   DIABETES MELLITUS OPHTHAMOLOGY EXAM 1-2 years   HEMOGLOBIN A1C EVERY 6 MONTHS  HIV Screening one time  And as  Indicated   Hepatitis C screening :Birth year [birth year] through [place] yearly   PNEUMOCOCCAL (PPSV) VACCINE AGE 33-64 [place] RISK,IMMUNOCOMP,ASPLENIC  TETANUS/TDAP AGE 58 + every 10 years     Health Maintenance, Male A healthy lifestyle and preventive care is important for your health and wellness. Ask your health care provider about what schedule of regular examinations is right for you. What should I know about weight and diet? Eat a Healthy Diet  Eat plenty of vegetables, fruits, whole grains, low-fat dairy products, and lean protein.  Do not eat a lot of foods high in solid fats, added sugars, or salt.  Maintain a Healthy Weight Regular exercise can help you achieve or maintain a healthy weight. You should:  Do at least 150 minutes of exercise each week. The exercise should increase your heart rate and make you sweat (moderate-intensity exercise).  Do strength-training exercises at least twice a week.  Watch Your Levels of Cholesterol and Blood Lipids  Have your blood tested for lipids and cholesterol every 5 years starting at 55 years of age. If you are at high risk for heart disease, you should start having your  blood tested when you are 55 years old. You may need to have your cholesterol levels checked more often if: ? Your lipid or cholesterol levels are high. ? You are older than 55 years of age. ? You are at high risk for heart disease.  What should I know about cancer screening? Many types  of cancers can be detected early and may often be prevented. Lung Cancer  You should be screened every year for lung cancer if: ? You are a current smoker who has smoked for at least 30 years. ? You are a former smoker who has quit within the past 15 years.  Talk to your health care provider about your screening options, when you should start screening, and how often you should be screened.  Colorectal Cancer  Routine colorectal cancer screening usually begins at 55 years of age and should be repeated every 5-10 years until you are 55 years old. You may need to be screened more often if early forms of precancerous polyps or small growths are found. Your health care provider may recommend screening at an earlier age if you have risk factors for colon cancer.  Your health care provider may recommend using home test kits to check for hidden blood in the stool.  A small camera at the end of a tube can be used to examine your colon (sigmoidoscopy or colonoscopy). This checks for the earliest forms of colorectal cancer.  Prostate and Testicular Cancer  Depending on your age and overall health, your health care provider may do certain tests to screen for prostate and testicular cancer.  Talk to your health care provider about any symptoms or concerns you have about testicular or prostate cancer.  Skin Cancer  Check your skin from head to toe regularly.  Tell your health care provider about any new moles or changes in moles, especially if: ? There is a change in a mole's size, shape, or color. ? You have a mole that is larger than a pencil eraser.  Always use sunscreen. Apply sunscreen liberally and repeat throughout the day.  Protect yourself by wearing long sleeves, pants, a wide-brimmed hat, and sunglasses when outside.  What should I know about heart disease, diabetes, and high blood pressure?  If you are 81-64 years of age, have your blood pressure checked every 3-5 years. If you  are 40 years of age or older, have your blood pressure checked every year. You should have your blood pressure measured twice-once when you are at a hospital or clinic, and once when you are not at a hospital or clinic. Record the average of the two measurements. To check your blood pressure when you are not at a hospital or clinic, you can use: ? An automated blood pressure machine at a pharmacy. ? A home blood pressure monitor.  Talk to your health care provider about your target blood pressure.  If you are between 77-46 years old, ask your health care provider if you should take aspirin to prevent heart disease.  Have regular diabetes screenings by checking your fasting blood sugar level. ? If you are at a normal weight and have a low risk for diabetes, have this test once every three years after the age of 42. ? If you are overweight and have a high risk for diabetes, consider being tested at a younger age or more often.  A one-time screening for abdominal aortic aneurysm (AAA) by ultrasound is recommended for men aged 61-75 years who are current  or former smokers. What should I know about preventing infection? Hepatitis B If you have a higher risk for hepatitis B, you should be screened for this virus. Talk with your health care provider to find out if you are at risk for hepatitis B infection. Hepatitis C Blood testing is recommended for:  Everyone born from [birth year] through 1963.  Anyone with known risk factors for hepatitis C.  Sexually Transmitted Diseases (STDs)  You should be screened each year for STDs including gonorrhea and chlamydia if: ? You are sexually active and are younger than 55 years of age. ? You are older than 55 years of age and your health care provider tells you that you are at risk for this type of infection. ? Your sexual activity has changed since you were last screened and you are at an increased risk for chlamydia or gonorrhea. Ask your health care provider if  you are at risk.  Talk with your health care provider about whether you are at high risk of being infected with HIV. Your health care provider may recommend a prescription medicine to help prevent HIV infection.  What else can I do?  Schedule regular health, dental, and eye exams.  Stay current with your vaccines (immunizations).  Do not use any tobacco products, such as cigarettes, chewing tobacco, and e-cigarettes. If you need help quitting, ask your health care provider.  Limit alcohol intake to no more than 2 drinks per day. One drink equals 12 ounces of beer, 5 ounces of wine, or 1 ounces of hard liquor.  Do not use street drugs.  Do not share needles.  Ask your health care provider for help if you need support or information about quitting drugs.  Tell your health care provider if you often feel depressed.  Tell your health care provider if you have ever been abused or do not feel safe at home. This information is not intended to replace advice given to you by your health care provider. Make sure you discuss any questions you have with your health care provider. Document Released: 06/13/2008 Document Revised: 08/14/2016 Document Reviewed: 09/19/2015 Elsevier Interactive Patient Education  2018 [place]. Kelsen Celona M.D.

## 2018-08-12 ENCOUNTER — Encounter: Payer: Self-pay | Admitting: Internal Medicine

## 2018-08-12 ENCOUNTER — Ambulatory Visit (INDEPENDENT_AMBULATORY_CARE_PROVIDER_SITE_OTHER): Payer: BLUE CROSS/BLUE SHIELD | Admitting: Internal Medicine

## 2018-08-12 VITALS — BP 138/82 | HR 68 | Temp 97.9°F | Ht 70.5 in | Wt 205.1 lb

## 2018-08-12 DIAGNOSIS — I10 Essential (primary) hypertension: Secondary | ICD-10-CM

## 2018-08-12 DIAGNOSIS — R7301 Impaired fasting glucose: Secondary | ICD-10-CM | POA: Diagnosis not present

## 2018-08-12 DIAGNOSIS — E119 Type 2 diabetes mellitus without complications: Secondary | ICD-10-CM

## 2018-08-12 DIAGNOSIS — E785 Hyperlipidemia, unspecified: Secondary | ICD-10-CM | POA: Diagnosis not present

## 2018-08-12 DIAGNOSIS — Z79899 Other long term (current) drug therapy: Secondary | ICD-10-CM | POA: Diagnosis not present

## 2018-08-12 DIAGNOSIS — Z0000 Encounter for general adult medical examination without abnormal findings: Secondary | ICD-10-CM | POA: Diagnosis not present

## 2018-08-12 MED ORDER — METFORMIN HCL 500 MG PO TABS: ORAL_TABLET | ORAL | 3 refills | Status: DC

## 2018-08-12 NOTE — Patient Instructions (Addendum)
a1c now in diabetic range   Inc metformin to maximum  lipids are better at goal continue Increase metformin to 4 per day  Get eye exam and   Colonoscopy.   PLan rov in  4-6 months or as needed   Health Maintenance Plans   COLONOSCOPY EVERY 10 YEARS  DIABETES MELLITUS FOOT EXAM yearly   DIABETES MELLITUS OPHTHAMOLOGY EXAM 1-2 years   HEMOGLOBIN A1C EVERY 6 MONTHS  HIV Screening one time  And as  Indicated   Hepatitis C screening :Birth year [birth year] through [place] yearly   PNEUMOCOCCAL (PPSV) VACCINE AGE 78-64 [place] RISK,IMMUNOCOMP,ASPLENIC  TETANUS/TDAP AGE 31 + every 10 years     Health Maintenance, Male A healthy lifestyle and preventive care is important for your health and wellness. Ask your health care provider about what schedule of regular examinations is right for you. What should I know about weight and diet? Eat a Healthy Diet  Eat plenty of vegetables, fruits, whole grains, low-fat dairy products, and lean protein.  Do not eat a lot of foods high in solid fats, added sugars, or salt.  Maintain a Healthy Weight Regular exercise can help you achieve or maintain a healthy weight. You should:  Do at least 150 minutes of exercise each week. The exercise should increase your heart rate and make you sweat (moderate-intensity exercise).  Do strength-training exercises at least twice a week.  Watch Your Levels of Cholesterol and Blood Lipids  Have your blood tested for lipids and cholesterol every 5 years starting at 55 years of age. If you are at high risk for heart disease, you should start having your blood tested when you are 55 years old. You may need to have your cholesterol levels checked more often if: ? Your lipid or cholesterol levels are high. ? You are older than 55 years of age. ? You are at high risk for heart disease.  What should I know about cancer screening? Many types of cancers can be detected early and may often  be prevented. Lung Cancer  You should be screened every year for lung cancer if: ? You are a current smoker who has smoked for at least 30 years. ? You are a former smoker who has quit within the past 15 years.  Talk to your health care provider about your screening options, when you should start screening, and how often you should be screened.  Colorectal Cancer  Routine colorectal cancer screening usually begins at 55 years of age and should be repeated every 5-10 years until you are 56 years old. You may need to be screened more often if early forms of precancerous polyps or small growths are found. Your health care provider may recommend screening at an earlier age if you have risk factors for colon cancer.  Your health care provider may recommend using home test kits to check for hidden blood in the stool.  A small camera at the end of a tube can be used to examine your colon (sigmoidoscopy or colonoscopy). This checks for the earliest forms of colorectal cancer.  Prostate and Testicular Cancer  Depending on your age and overall health, your health care provider may do certain tests to screen for prostate and testicular cancer.  Talk to your health care provider about any symptoms or concerns you have about testicular or prostate cancer.  Skin Cancer  Check your skin from head to toe regularly.  Tell your health  care provider about any new moles or changes in moles, especially if: ? There is a change in a mole's size, shape, or color. ? You have a mole that is larger than a pencil eraser.  Always use sunscreen. Apply sunscreen liberally and repeat throughout the day.  Protect yourself by wearing long sleeves, pants, a wide-brimmed hat, and sunglasses when outside.  What should I know about heart disease, diabetes, and high blood pressure?  If you are 72-29 years of age, have your blood pressure checked every 3-5 years. If you are 52 years of age or older, have your blood  pressure checked every year. You should have your blood pressure measured twice-once when you are at a hospital or clinic, and once when you are not at a hospital or clinic. Record the average of the two measurements. To check your blood pressure when you are not at a hospital or clinic, you can use: ? An automated blood pressure machine at a pharmacy. ? A home blood pressure monitor.  Talk to your health care provider about your target blood pressure.  If you are between 27-68 years old, ask your health care provider if you should take aspirin to prevent heart disease.  Have regular diabetes screenings by checking your fasting blood sugar level. ? If you are at a normal weight and have a low risk for diabetes, have this test once every three years after the age of 33. ? If you are overweight and have a high risk for diabetes, consider being tested at a younger age or more often.  A one-time screening for abdominal aortic aneurysm (AAA) by ultrasound is recommended for men aged 63-75 years who are current or former smokers. What should I know about preventing infection? Hepatitis B If you have a higher risk for hepatitis B, you should be screened for this virus. Talk with your health care provider to find out if you are at risk for hepatitis B infection. Hepatitis C Blood testing is recommended for:  Everyone born from [birth year] through 1963.  Anyone with known risk factors for hepatitis C.  Sexually Transmitted Diseases (STDs)  You should be screened each year for STDs including gonorrhea and chlamydia if: ? You are sexually active and are younger than 55 years of age. ? You are older than 55 years of age and your health care provider tells you that you are at risk for this type of infection. ? Your sexual activity has changed since you were last screened and you are at an increased risk for chlamydia or gonorrhea. Ask your health care provider if you are at risk.  Talk with your health  care provider about whether you are at high risk of being infected with HIV. Your health care provider may recommend a prescription medicine to help prevent HIV infection.  What else can I do?  Schedule regular health, dental, and eye exams.  Stay current with your vaccines (immunizations).  Do not use any tobacco products, such as cigarettes, chewing tobacco, and e-cigarettes. If you need help quitting, ask your health care provider.  Limit alcohol intake to no more than 2 drinks per day. One drink equals 12 ounces of beer, 5 ounces of wine, or 1 ounces of hard liquor.  Do not use street drugs.  Do not share needles.  Ask your health care provider for help if you need support or information about quitting drugs.  Tell your health care provider if you often feel depressed.  Tell your  health care provider if you have ever been abused or do not feel safe at home. This information is not intended to replace advice given to you by your health care provider. Make sure you discuss any questions you have with your health care provider. Document Released: 06/13/2008 Document Revised: 08/14/2016 Document Reviewed: 09/19/2015 Elsevier Interactive Patient Education  [employer].

## 2018-10-29 ENCOUNTER — Encounter: Payer: Self-pay | Admitting: Internal Medicine

## 2018-10-29 ENCOUNTER — Ambulatory Visit: Payer: BLUE CROSS/BLUE SHIELD | Admitting: Internal Medicine

## 2018-10-29 VITALS — BP 130/68 | HR 61 | Temp 98.0°F | Wt 201.9 lb

## 2018-10-29 DIAGNOSIS — Z79899 Other long term (current) drug therapy: Secondary | ICD-10-CM | POA: Diagnosis not present

## 2018-10-29 DIAGNOSIS — I10 Essential (primary) hypertension: Secondary | ICD-10-CM

## 2018-10-29 DIAGNOSIS — R7301 Impaired fasting glucose: Secondary | ICD-10-CM | POA: Diagnosis not present

## 2018-10-29 LAB — POCT GLYCOSYLATED HEMOGLOBIN (HGB A1C): HEMOGLOBIN A1C: 6.1 % — AB (ref 4.0–5.6)

## 2018-10-29 NOTE — Progress Notes (Signed)
Chief Complaint  Patient presents with  . Follow-up    A1C check.  Discuss ASA 81 tab    HPI: David Copeland 57 y.o. come in for   bg  Doing well going to do a race in 2 weeks  Training  ?bout  Asa  Hje;[p or not   No se and no bleeding   Hx    Gi upset  ROS: See pertinent positives and negatives per HPI.  Past Medical History:  Diagnosis Date  . History of diverticulitis of colon 8/05  . Hyperglycemia   . Hyperlipidemia   . Hypertension    off med after lifestyle intervention contolled    Family History  Problem Relation Age of Onset  . Stroke Father        in hot weather  . Breast cancer Mother   . Stroke Other   . Arthritis Neg Hx     Social History   Socioeconomic History  . Marital status: Married    Spouse name: Not on file  . Number of children: Not on file  . Years of education: Not on file  . Highest education level: Not on file  Occupational History  . Not on file  Social Needs  . Financial resource strain: Not on file  . Food insecurity:    Worry: Not on file    Inability: Not on file  . Transportation needs:    Medical: Not on file    Non-medical: Not on file  Tobacco Use  . Smoking status: Never Smoker  . Smokeless tobacco: Never Used  Substance and Sexual Activity  . Alcohol use: Yes    Comment: occasional  . Drug use: No  . Sexual activity: Not on file  Lifestyle  . Physical activity:    Days per week: Not on file    Minutes per session: Not on file  . Stress: Not on file  Relationships  . Social connections:    Talks on phone: Not on file    Gets together: Not on file    Attends religious service: Not on file    Active member of club or organization: Not on file    Attends meetings of clubs or organizations: Not on file    Relationship status: Not on file  Other Topics Concern  . Not on file  Social History Narrative   Married with children   hhof 5   2 dogs    Former smoker   Regular exercise-yes  does marathon    Is  [place]   No est. Runs   Sleep adequate   40- 50 jours per week.                   Outpatient Medications Prior to Visit  Medication Sig Dispense Refill  . aspirin 81 MG tablet Take 81 mg by mouth daily.      Marland Kitchen lisinopril (PRINIVIL,ZESTRIL) 5 MG tablet TAKE 1 TABLET DAILY 90 tablet 1  . metFORMIN (GLUCOPHAGE) 500 MG tablet Take  4 per day. 360 tablet 3  . rosuvastatin (CRESTOR) 20 MG tablet Take 1 tablet (20 mg total) by mouth daily. 90 tablet 3   No facility-administered medications prior to visit.      EXAM:  BP 130/68 (BP Location: Right Arm, Patient Position: Sitting, Cuff Size: Normal)   Pulse 61   Temp 98 F (36.7 C) (Oral)   Wt 201 lb 14.4 oz (91.6 kg)   BMI 28.56  kg/m   Body mass index is 28.56 kg/m.  GENERAL: vitals reviewed and listed above, alert, oriented, appears well hydrated and in no acute distress HEENT: atraumatic, conjunctiva  clear, no obvious abnormalities on inspection of external nose and ears OP : no lesion edema or exudate  NECK: no obvious masses on inspection palpation  LUNGS: clear to auscultation bilaterally, no wheezes, rales or rhonchi, good air movement CV: HRRR, no clubbing cyanosis or  peripheral edema nl cap refill  MS: moves all extremities without noticeable focal  abnormality PSYCH: pleasant and cooperative, no obvious depression or anxiety Lab Results  Component Value Date   WBC 5.2 07/27/2018   HGB 14.4 07/27/2018   HCT 41.8 07/27/2018   PLT 172.0 07/27/2018   GLUCOSE 132 (H) 07/27/2018   CHOL 161 07/27/2018   TRIG 97.0 07/27/2018   HDL 59.70 07/27/2018   LDLCALC 82 07/27/2018   ALT 19 07/27/2018   AST 22 07/27/2018   NA 140 07/27/2018   K 4.1 07/27/2018   CL 104 07/27/2018   CREATININE 1.01 07/27/2018   BUN 18 07/27/2018   CO2 25 07/27/2018   TSH 1.71 05/08/2016   PSA 1.36 07/27/2018   HGBA1C 6.1 (A) 10/29/2018   MICROALBUR 0.3 11/23/2013   BP Readings from Last 3 Encounters:  10/29/18 130/68    08/12/18 138/82  02/03/18 132/90   Wt Readings from Last 3 Encounters:  10/29/18 201 lb 14.4 oz (91.6 kg)  08/12/18 205 lb 1.6 oz (93 kg)  02/03/18 207 lb 6.4 oz (94.1 kg)    ASSESSMENT AND PLAN:  Discussed the following assessment and plan:  Impaired fasting glucose - much much better    continue  rov in 6 mos   Medication management - Plan: POC HgB A1c  Essential hypertension Had cpx in July   ucnertain   Benefit risk of asa for him . Ok to stop since on statin .    -Patient advised to return or notify health care team  if  new concerns arise.  Patient Instructions  Uncertain risk benefit at your age and condition   Risk of    Bleeding is risk  .    Since  You are on a statin medication   Benefit may be less.    Continue lifestyle intervention healthy eating and exercise . And medds   ROV in  6 mos     David Copeland. David Copeland M.D.

## 2018-10-29 NOTE — Patient Instructions (Addendum)
Uncertain risk benefit at your age and condition   Risk of    Bleeding is risk  .    Since  You are on a statin medication   Benefit may be less.    Continue lifestyle intervention healthy eating and exercise . And medds   ROV in  6 mos

## 2019-01-10 ENCOUNTER — Other Ambulatory Visit: Payer: Self-pay | Admitting: Internal Medicine

## 2019-03-03 ENCOUNTER — Other Ambulatory Visit: Payer: Self-pay | Admitting: Internal Medicine

## 2019-04-23 NOTE — Progress Notes (Signed)
Virtual Visit via Video Note  I connected with@ on 04/27/19 at  9:00 AM EDT by a video enabled telemedicine application and verified that I am speaking with the correct person using two identifiers. Location patient: home Location provider:work  office Persons participating in the virtual visit: patient, provider  [place] national recommendations  regarding COVID 19 pandemic   video visit is advised over in office visit for this patient.  Pt aware of  limitations of evaluation and management by telemedicine and  availability of in person appointments. The patient expressed understanding and agreed to proceed.   HPI: [place]  Presents for video visit   Instead of in  Person   For   Chronic disease management   BP  Has been ok 135/85 range  Continues to exercise 30 miles running her week  HLD on crestor   BG taking 1000 mg bid  Metformin without sx  No eye or neuro sx    Sleep 7 hours  Working from home  Now 10 hours per day   Work isolation meds   ROS: See pertinent positives and negatives per HPI. No cv pulm or neur sx   Past Medical History:  Diagnosis Date  . History of diverticulitis of colon 8/05  . Hyperglycemia   . Hyperlipidemia   . Hypertension    off med after lifestyle intervention contolled    Past Surgical History:  Procedure Laterality Date  . ADENOIDECTOMY    . removed breast glands     damaged secondary to soccer  . TONSILLECTOMY    . WISDOM TOOTH EXTRACTION      Family History  Problem Relation Age of Onset  . Stroke Father        in hot weather  . Breast cancer Mother   . Stroke Other   . Arthritis Neg Hx     Social History   Tobacco Use  . Smoking status: Never Smoker  . Smokeless tobacco: Never Used  Substance Use Topics  . Alcohol use: Yes    Comment: occasional  . Drug use: No      Current Outpatient Medications:  .  aspirin 81 MG tablet, Take 81 mg by mouth daily.  , Disp: , Rfl:  .  lisinopril (PRINIVIL,ZESTRIL)  5 MG tablet, TAKE 1 TABLET DAILY, Disp: 90 tablet, Rfl: 4 .  metFORMIN (GLUCOPHAGE) 500 MG tablet, Take  4 per day., Disp: 360 tablet, Rfl: 3 .  rosuvastatin (CRESTOR) 20 MG tablet, TAKE 1 TABLET DAILY, Disp: 90 tablet, Rfl: 4  EXAM: BP Readings from Last 3 Encounters:  10/29/18 130/68  08/12/18 138/82  02/03/18 132/90    VITALS per patient if applicable: looks well  See above reports 135/85 this am   GENERAL: alert, oriented, appears well and in no acute distress  HEENT: atraumatic, conjunttiva clear, no obvious abnormalities on inspection of external nose and ears  NECK: normal movements of the head and neck  LUNGS: on inspection no signs of respiratory distress, breathing rate appears normal, no obvious gross SOB, gasping or wheezing  CV: no obvious cyanosis  MS: moves all visible extremities without noticeable abnormality  PSYCH/NEURO: pleasant and cooperative, no obvious depression or anxiety, speech and thought processing grossly intact Lab Results  Component Value Date   WBC 5.2 07/27/2018   HGB 14.4 07/27/2018   HCT 41.8 07/27/2018   PLT 172.0 07/27/2018   GLUCOSE 132 (H) 07/27/2018   CHOL 161 07/27/2018   TRIG 97.0 07/27/2018  HDL 59.70 07/27/2018   LDLCALC 82 07/27/2018   ALT 19 07/27/2018   AST 22 07/27/2018   NA 140 07/27/2018   K 4.1 07/27/2018   CL 104 07/27/2018   CREATININE 1.01 07/27/2018   BUN 18 07/27/2018   CO2 25 07/27/2018   TSH 1.71 05/08/2016   PSA 1.36 07/27/2018   HGBA1C 6.1 (A) 10/29/2018   MICROALBUR 0.3 11/23/2013    ASSESSMENT AND PLAN:  Discussed the following assessment and plan:  Impaired fasting glucose - Plan: POCT glycosylated hemoglobin (Hb K2I), Basic metabolic panel, CBC with Differential/Platelet, Hemoglobin A1c, Hepatic function panel, Lipid panel, PSA  Medication management - Plan: POCT glycosylated hemoglobin (Hb O9B), Basic metabolic panel, CBC with Differential/Platelet, Hemoglobin A1c, Hepatic function panel,  Lipid panel, PSA  Essential hypertension - Plan: Basic metabolic panel, CBC with Differential/Platelet, Hemoglobin A1c, Hepatic function panel, Lipid panel, PSA  Hyperlipidemia, unspecified hyperlipidemia type - Plan: Basic metabolic panel, CBC with Differential/Platelet, Hemoglobin A1c, Hepatic function panel, Lipid panel, PSA  Screening PSA (prostate specific antigen) - future lab order - Plan: Basic metabolic panel, CBC with Differential/Platelet, Hemoglobin A1c, Hepatic function panel, Lipid panel, PSA  Visit for preventive health exam lab orders  - Plan: Basic metabolic panel, CBC with Differential/Platelet, Hemoglobin A1c, Hepatic function panel, Lipid panel, PSA Plan a1c  This week  poct in office  Counseled.  cpx due  august     Orders placed   Expectant management and discussion of plan and treatment with patient with opportunity to ask questions and all were answered. The patient agreed with the plan and demonstrated an understanding of the instructions.  cpx  August    E 2-20 The patient was advised to call back or seek an in-person evaluation if worsening  or having concerns . In the interim but seems to be doing well.   bp goal reviewed     Shanon Ace, MD

## 2019-04-27 ENCOUNTER — Ambulatory Visit (INDEPENDENT_AMBULATORY_CARE_PROVIDER_SITE_OTHER): Payer: BLUE CROSS/BLUE SHIELD | Admitting: Internal Medicine

## 2019-04-27 ENCOUNTER — Telehealth: Payer: Self-pay

## 2019-04-27 ENCOUNTER — Encounter: Payer: Self-pay | Admitting: Internal Medicine

## 2019-04-27 ENCOUNTER — Other Ambulatory Visit: Payer: Self-pay

## 2019-04-27 DIAGNOSIS — E785 Hyperlipidemia, unspecified: Secondary | ICD-10-CM

## 2019-04-27 DIAGNOSIS — Z79899 Other long term (current) drug therapy: Secondary | ICD-10-CM

## 2019-04-27 DIAGNOSIS — R7301 Impaired fasting glucose: Secondary | ICD-10-CM | POA: Diagnosis not present

## 2019-04-27 DIAGNOSIS — Z0000 Encounter for general adult medical examination without abnormal findings: Secondary | ICD-10-CM

## 2019-04-27 DIAGNOSIS — I10 Essential (primary) hypertension: Secondary | ICD-10-CM

## 2019-04-27 DIAGNOSIS — Z125 Encounter for screening for malignant neoplasm of prostate: Secondary | ICD-10-CM

## 2019-04-27 NOTE — Telephone Encounter (Signed)
lvm for pt to call back to change appt to virtual visit

## 2019-08-20 ENCOUNTER — Other Ambulatory Visit: Payer: Self-pay | Admitting: Internal Medicine

## 2019-09-07 DIAGNOSIS — E119 Type 2 diabetes mellitus without complications: Secondary | ICD-10-CM | POA: Diagnosis not present

## 2020-02-20 ENCOUNTER — Other Ambulatory Visit: Payer: Self-pay | Admitting: Internal Medicine

## 2020-06-17 ENCOUNTER — Other Ambulatory Visit: Payer: Self-pay | Admitting: Internal Medicine

## 2020-07-04 ENCOUNTER — Telehealth: Payer: Self-pay | Admitting: Internal Medicine

## 2020-07-04 NOTE — Telephone Encounter (Signed)
Pt need refill on lisinopril (ZESTRIL) 5 MG tablet and rosuvastatin (CRESTOR) 20 MG tablet  Sent to  [id number] [address], [address] Phone:  [phone]  Fax:  [phone]

## 2020-07-04 NOTE — Telephone Encounter (Signed)
Called patient and LMOVM to return call  Left a detailed voice message for patient to call back. I need to let him know that he will need to come in sooner for labs and visit since his last labs were in 2019 and it has been over a year since he was last seen in the office.

## 2020-07-04 NOTE — Telephone Encounter (Signed)
Pt next  appt is on 10/09/20.

## 2020-07-04 NOTE — Telephone Encounter (Signed)
Pt calling back with pharmacy information. Pt would like medications sent to [facility] not [facility]. Thanks

## 2020-07-12 NOTE — Progress Notes (Signed)
Chief Complaint  Patient presents with  . Annual Exam    Doing well  . Medication Refill  . Hypertension    HPI: Patient  David Copeland  59 y.o. comes in today for Preventive Health Care visit  And med refills  Over due dela from pandemic BP out of med ofr at least a month HLD out of med  For ,mnth Pre diabetes   On metformin ok  utd   Eye exam last July  Du this  Month No concerns  Health Maintenance  Topic Date Due  . OPHTHALMOLOGY EXAM  Never done  . HIV Screening  Never done  . COLONOSCOPY  Never done  . HEMOGLOBIN A1C  04/29/2019  . TETANUS/TDAP  05/16/2019  . INFLUENZA VACCINE  07/30/2020  . FOOT EXAM  07/14/2021  . PNEUMOCOCCAL POLYSACCHARIDE VACCINE AGE 81-64 HIGH RISK  Completed  . COVID-19 Vaccine  Completed  . Hepatitis C Screening  Completed   Health Maintenance Review LIFESTYLE:  Exercise:    About the same  5 hours per week running  Tobacco/ETS:no Alcohol:  2- 3 per week  Sugar beverages: no x oj Sleep: 7 hours   Fluctuates   Drug use: no HH of 4  Work:  From home   45 per week.  Office may be one day a week.   ROS:  GEN/ HEENT: No fever, significant weight changes sweats headaches vision problems hearing changes, CV/ PULM; No chest pain shortness of breath cough, syncope,edema  change in exercise tolerance. GI /GU: No adominal pain, vomiting, change in bowel habits. No blood in the stool. No significant GU symptoms. SKIN/HEME: ,no acute skin rashes suspicious lesions or bleeding. No lymphadenopathy, nodules, masses.  NEURO/ PSYCH:  No neurologic signs such as weakness numbness. No depression anxiety. IMM/ Allergy: No unusual infections.  Allergy .   REST of 12 system review negative except as per HPI   Past Medical History:  Diagnosis Date  . History of diverticulitis of colon 8/05  . Hyperglycemia   . Hyperlipidemia   . Hypertension    off med after lifestyle intervention contolled    Past Surgical History:  Procedure Laterality  Date  . ADENOIDECTOMY    . removed breast glands     damaged secondary to soccer  . TONSILLECTOMY    . WISDOM TOOTH EXTRACTION      Family History  Problem Relation Age of Onset  . Stroke Father        in hot weather  . Breast cancer Mother   . Stroke Other   . Arthritis Neg Hx     Social History   Socioeconomic History  . Marital status: Married    Spouse name: Not on file  . Number of children: Not on file  . Years of education: Not on file  . Highest education level: Not on file  Occupational History  . Not on file  Tobacco Use  . Smoking status: Never Smoker  . Smokeless tobacco: Never Used  Vaping Use  . Vaping Use: Never used  Substance and Sexual Activity  . Alcohol use: Yes    Comment: occasional  . Drug use: No  . Sexual activity: Not on file  Other Topics Concern  . Not on file  Social History Narrative   Married with children   hhof 5   2 dogs    Former smoker   Regular exercise-yes  does marathon    Is [place] as [place]  No est. Runs   Sleep adequate   40- 50 jours per week.                  Social Determinants of Health   Financial Resource Strain:   . Difficulty of Paying Living Expenses:   Food Insecurity:   . Worried About Charity fundraiser in the Last Year:   . Arboriculturist in the Last Year:   Transportation Needs:   . Film/video editor (Medical):   Marland Kitchen Lack of Transportation (Non-Medical):   Physical Activity:   . Days of Exercise per Week:   . Minutes of Exercise per Session:   Stress:   . Feeling of Stress :   Social Connections:   . Frequency of Communication with Friends and Family:   . Frequency of Social Gatherings with Friends and Family:   . Attends Religious Services:   . Active Member of Clubs or Organizations:   . Attends Archivist Meetings:   Marland Kitchen Marital Status:     Outpatient Medications Prior to Visit  Medication Sig Dispense Refill  . aspirin 81 MG tablet Take 81 mg by mouth daily.        Marland Kitchen lisinopril (ZESTRIL) 5 MG tablet Take 1 tablet (5 mg total) by mouth daily. NEEDS VISIT FOR FURTHER REFILLS 90 tablet 0  . metFORMIN (GLUCOPHAGE) 500 MG tablet TAKE 4 TABLETS DAILY 360 tablet 3  . rosuvastatin (CRESTOR) 20 MG tablet TAKE 1 TABLET DAILY 90 tablet 4   No facility-administered medications prior to visit.     EXAM:  BP (!) 148/86   Pulse 69   Temp 98.2 F (36.8 C) (Temporal)   Ht 5' 10.8" (1.798 m)   Wt 202 lb 9.6 oz (91.9 kg)   SpO2 97%   BMI 28.42 kg/m   Body mass index is 28.42 kg/m. Wt Readings from Last 3 Encounters:  07/14/20 202 lb 9.6 oz (91.9 kg)  10/29/18 201 lb 14.4 oz (91.6 kg)  08/12/18 205 lb 1.6 oz (93 kg)    Physical Exam: Vital signs reviewed KKX:FGHW is a well-developed well-nourished alert cooperative    who appearsr stated age in no acute distress.  HEENT: normocephalic atraumatic , Eyes: PERRL EOM's full, conjunctiva clear, Nares: paten,t no deformity discharge or tenderness., Ears: no deformity EAC's clear TMs with normal landmarks. Mouth: masked NECK: supple without masses, thyromegaly or bruits. CHEST/PULM:  Clear to auscultation and percussion breath sounds equal no wheeze , rales or rhonchi. No chest wall deformities or tenderness. CV: PMI is nondisplaced, S1 S2 no gallops, murmurs, rubs. Peripheral pulses are full without delay.No JVD .  ABDOMEN: Bowel sounds normal nontender  No guard or rebound, no hepato splenomegal no CVA tenderness.   Extremtities:  No clubbing cyanosis or edema, no acute joint swelling or redness no focal atrophy NEURO:  Oriented x3, cranial nerves 3-12 appear to be intact, no obvious focal weakness,gait within normal limits no abnormal reflexes or asymmetrical SKIN: No acute rashes normal turgor, color, no bruising or petechiae. Toe nail thickneing bilaterally PSYCH: Oriented, good eye contact, no obvious depression anxiety, cognition and judgment appear normal. LN: no cervical axillary inguinal  adenopathy Diabetic Foot Exam - Simple   Simple Foot Form Diabetic Foot exam was performed with the following findings: Yes 07/14/2020  9:00 AM  Visual Inspection No deformities, no ulcerations, no other skin breakdown bilaterally: Yes See comments: Yes Sensation Testing Intact to touch and monofilament testing bilaterally: Yes Pulse  Check Posterior Tibialis and Dorsalis pulse intact bilaterally: Yes Comments Thickened nails  Onycho without redness      Lab Results  Component Value Date   WBC 5.2 07/27/2018   HGB 14.4 07/27/2018   HCT 41.8 07/27/2018   PLT 172.0 07/27/2018   GLUCOSE 132 (H) 07/27/2018   CHOL 161 07/27/2018   TRIG 97.0 07/27/2018   HDL 59.70 07/27/2018   LDLCALC 82 07/27/2018   ALT 19 07/27/2018   AST 22 07/27/2018   NA 140 07/27/2018   K 4.1 07/27/2018   CL 104 07/27/2018   CREATININE 1.01 07/27/2018   BUN 18 07/27/2018   CO2 25 07/27/2018   TSH 1.71 05/08/2016   PSA 1.36 07/27/2018   HGBA1C 6.1 (A) 10/29/2018   MICROALBUR 0.3 11/23/2013    BP Readings from Last 3 Encounters:  07/14/20 (!) 148/86  10/29/18 130/68  08/12/18 138/82    Lab plan  reviewed with patient   ASSESSMENT AND PLAN:  Discussed the following assessment and plan:    ICD-10-CM   1. Visit for preventive health examination  D53.29 Basic metabolic panel    CBC with Differential/Platelet    Hemoglobin A1c    Hepatic function panel    Lipid panel    TSH    PSA    Microalbumin / creatinine urine ratio    Microalbumin / creatinine urine ratio    PSA    TSH    Lipid panel    Hepatic function panel    Hemoglobin A1c    CBC with Differential/Platelet    Basic metabolic panel  2. Hyperlipidemia, unspecified hyperlipidemia type  J24.2 Basic metabolic panel    CBC with Differential/Platelet    Hemoglobin A1c    Hepatic function panel    Lipid panel    TSH    PSA    Microalbumin / creatinine urine ratio    Microalbumin / creatinine urine ratio    PSA    TSH    Lipid  panel    Hepatic function panel    Hemoglobin A1c    CBC with Differential/Platelet    Basic metabolic panel  3. Essential hypertension  A83 Basic metabolic panel    CBC with Differential/Platelet    Hemoglobin A1c    Hepatic function panel    Lipid panel    TSH    PSA    Microalbumin / creatinine urine ratio    Microalbumin / creatinine urine ratio    PSA    TSH    Lipid panel    Hepatic function panel    Hemoglobin A1c    CBC with Differential/Platelet    Basic metabolic panel  4. Medication management  M19.622 Basic metabolic panel    CBC with Differential/Platelet    Hemoglobin A1c    Hepatic function panel    Lipid panel    TSH    PSA    Microalbumin / creatinine urine ratio    Microalbumin / creatinine urine ratio    PSA    TSH    Lipid panel    Hepatic function panel    Hemoglobin A1c    CBC with Differential/Platelet    Basic metabolic panel  5. Impaired fasting glucose  W97.98 Basic metabolic panel    CBC with Differential/Platelet    Hemoglobin A1c    Hepatic function panel    Lipid panel    TSH    PSA    Microalbumin / creatinine urine ratio  Microalbumin / creatinine urine ratio    PSA    TSH    Lipid panel    Hepatic function panel    Hemoglobin A1c    CBC with Differential/Platelet    Basic metabolic panel   prdiabets vs cwell controlled diabetes   6. Screening PSA (prostate specific antigen)  Z12.5 PSA    PSA  monitoring lab today realizin although fasting out of cretsor and lisinopril  Keep oct appt dending o n labs and bp control  If all is well may do 6 mos check  Get you colonscopy or other screening DR Collene Mares  Return in about 6 months (around 01/14/2021) for depending on results.  Patient Care Team: Nakiya Rallis, Standley Brooking, MD as PCP - General Juanita Craver, MD as Consulting Physician (Gastroenterology) Patient Instructions  Get  Your colon cancer screening   Keep utd on eye exam     Check your bp after getting back on medication   For a  few weeks nd send in readings.    Continue lifestyle intervention healthy eating and exercise .    Plan rov of fu in about 6 months or as needed    Preventive Care 97-62 Years Old, Male Preventive care refers to lifestyle choices and visits with your health care provider that can promote health and wellness. This includes:  A yearly physical exam. This is also called an annual well check.  Regular dental and eye exams.  Immunizations.  Screening for certain conditions.  Healthy lifestyle choices, such as eating a healthy diet, getting regular exercise, not using drugs or products that contain nicotine and tobacco, and limiting alcohol use. What can I expect for my preventive care visit? Physical exam Your health care provider will check:  Height and weight. These may be used to calculate body mass index (BMI), which is a measurement that tells if you are at a healthy weight.  Heart rate and blood pressure.  Your skin for abnormal spots. Counseling Your health care provider may ask you questions about:  Alcohol, tobacco, and drug use.  Emotional well-being.  Home and relationship well-being.  Sexual activity.  Eating habits.  Work and work Statistician. What immunizations do I need?  Influenza (flu) vaccine  This is recommended every year. Tetanus, diphtheria, and pertussis (Tdap) vaccine  You may need a Td booster every 10 years. Varicella (chickenpox) vaccine  You may need this vaccine if you have not already been vaccinated. Zoster (shingles) vaccine  You may need this after age 57. Measles, mumps, and rubella (MMR) vaccine  You may need at least one dose of MMR if you were born in 1957 or later. You may also need a second dose. Pneumococcal conjugate (PCV13) vaccine  You may need this if you have certain conditions and were not previously vaccinated. Pneumococcal polysaccharide (PPSV23) vaccine  You may need one or two doses if you smoke cigarettes  or if you have certain conditions. Meningococcal conjugate (MenACWY) vaccine  You may need this if you have certain conditions. Hepatitis A vaccine  You may need this if you have certain conditions or if you travel or work in places where you may be exposed to hepatitis A. Hepatitis B vaccine  You may need this if you have certain conditions or if you travel or work in places where you may be exposed to hepatitis B. Haemophilus influenzae type b (Hib) vaccine  You may need this if you have certain risk factors. Human papillomavirus (HPV) vaccine  If  recommended by your health care provider, you may need three doses over 6 months. You may receive vaccines as individual doses or as more than one vaccine together in one shot (combination vaccines). Talk with your health care provider about the risks and benefits of combination vaccines. What tests do I need? Blood tests  Lipid and cholesterol levels. These may be checked every 5 years, or more frequently if you are over 76 years old.  Hepatitis C test.  Hepatitis B test. Screening  Lung cancer screening. You may have this screening every year starting at age 79 if you have a 30-pack-year history of smoking and currently smoke or have quit within the past 15 years.  Prostate cancer screening. Recommendations will vary depending on your family history and other risks.  Colorectal cancer screening. All adults should have this screening starting at age 40 and continuing until age 54. Your health care provider may recommend screening at age 64 if you are at increased risk. You will have tests every 1-10 years, depending on your results and the type of screening test.  Diabetes screening. This is done by checking your blood sugar (glucose) after you have not eaten for a while (fasting). You may have this done every 1-3 years.  Sexually transmitted disease (STD) testing. Follow these instructions at home: Eating and drinking  Eat a diet  that includes fresh fruits and vegetables, whole grains, lean protein, and low-fat dairy products.  Take vitamin and mineral supplements as recommended by your health care provider.  Do not drink alcohol if your health care provider tells you not to drink.  If you drink alcohol: ? Limit how much you have to 0-2 drinks a day. ? Be aware of how much alcohol is in your drink. In the [place] one drink equals one 12 oz bottle of beer (355 mL), one 5 oz glass of wine (148 mL), or one 1 oz glass of hard liquor (44 mL). Lifestyle  Take daily care of your teeth and gums.  Stay active. Exercise for at least 30 minutes on 5 or more days each week.  Do not use any products that contain nicotine or tobacco, such as cigarettes, e-cigarettes, and chewing tobacco. If you need help quitting, ask your health care provider.  If you are sexually active, practice safe sex. Use a condom or other form of protection to prevent STIs (sexually transmitted infections).  Talk with your health care provider about taking a low-dose aspirin every day starting at age 44. What's next?  Go to your health care provider once a year for a well check visit.  Ask your health care provider how often you should have your eyes and teeth checked.  Stay up to date on all vaccines. This information is not intended to replace advice given to you by your health care provider. Make sure you discuss any questions you have with your health care provider. Document Revised: 12/10/2018 Document Reviewed: 12/10/2018 Elsevier Patient Education  2020 [place] Bernhardt Riemenschneider M.D.

## 2020-07-12 NOTE — Telephone Encounter (Signed)
Patient called back and scheduled for 07/14/2020 for med refills.

## 2020-07-14 ENCOUNTER — Encounter: Payer: Self-pay | Admitting: Internal Medicine

## 2020-07-14 ENCOUNTER — Other Ambulatory Visit: Payer: Self-pay

## 2020-07-14 ENCOUNTER — Ambulatory Visit: Payer: BC Managed Care – PPO | Admitting: Internal Medicine

## 2020-07-14 VITALS — BP 148/86 | HR 69 | Temp 98.2°F | Ht 70.8 in | Wt 202.6 lb

## 2020-07-14 DIAGNOSIS — Z125 Encounter for screening for malignant neoplasm of prostate: Secondary | ICD-10-CM

## 2020-07-14 DIAGNOSIS — Z79899 Other long term (current) drug therapy: Secondary | ICD-10-CM

## 2020-07-14 DIAGNOSIS — E785 Hyperlipidemia, unspecified: Secondary | ICD-10-CM | POA: Diagnosis not present

## 2020-07-14 DIAGNOSIS — Z0000 Encounter for general adult medical examination without abnormal findings: Secondary | ICD-10-CM

## 2020-07-14 DIAGNOSIS — R7301 Impaired fasting glucose: Secondary | ICD-10-CM | POA: Diagnosis not present

## 2020-07-14 DIAGNOSIS — I10 Essential (primary) hypertension: Secondary | ICD-10-CM | POA: Diagnosis not present

## 2020-07-14 MED ORDER — METFORMIN HCL 500 MG PO TABS: ORAL_TABLET | ORAL | 3 refills | Status: DC

## 2020-07-14 MED ORDER — LISINOPRIL 5 MG PO TABS: 5.0000 mg | ORAL_TABLET | Freq: Every day | ORAL | 3 refills | Status: DC

## 2020-07-14 MED ORDER — ROSUVASTATIN CALCIUM 20 MG PO TABS: 20.0000 mg | ORAL_TABLET | Freq: Every day | ORAL | 3 refills | Status: DC

## 2020-07-14 NOTE — Patient Instructions (Addendum)
Get  Your colon cancer screening   Keep utd on eye exam     Check your bp after getting back on medication   For a few weeks nd send in readings.    Continue lifestyle intervention healthy eating and exercise .    Plan rov of fu in about 6 months or as needed    Preventive Care 20-57 Years Old, Male Preventive care refers to lifestyle choices and visits with your health care provider that can promote health and wellness. This includes:  A yearly physical exam. This is also called an annual well check.  Regular dental and eye exams.  Immunizations.  Screening for certain conditions.  Healthy lifestyle choices, such as eating a healthy diet, getting regular exercise, not using drugs or products that contain nicotine and tobacco, and limiting alcohol use. What can I expect for my preventive care visit? Physical exam Your health care provider will check:  Height and weight. These may be used to calculate body mass index (BMI), which is a measurement that tells if you are at a healthy weight.  Heart rate and blood pressure.  Your skin for abnormal spots. Counseling Your health care provider may ask you questions about:  Alcohol, tobacco, and drug use.  Emotional well-being.  Home and relationship well-being.  Sexual activity.  Eating habits.  Work and work Astronomer. What immunizations do I need?  Influenza (flu) vaccine  This is recommended every year. Tetanus, diphtheria, and pertussis (Tdap) vaccine  You may need a Td booster every 10 years. Varicella (chickenpox) vaccine  You may need this vaccine if you have not already been vaccinated. Zoster (shingles) vaccine  You may need this after age 57. Measles, mumps, and rubella (MMR) vaccine  You may need at least one dose of MMR if you were born in 1957 or later. You may also need a second dose. Pneumococcal conjugate (PCV13) vaccine  You may need this if you have certain conditions and were not  previously vaccinated. Pneumococcal polysaccharide (PPSV23) vaccine  You may need one or two doses if you smoke cigarettes or if you have certain conditions. Meningococcal conjugate (MenACWY) vaccine  You may need this if you have certain conditions. Hepatitis A vaccine  You may need this if you have certain conditions or if you travel or work in places where you may be exposed to hepatitis A. Hepatitis B vaccine  You may need this if you have certain conditions or if you travel or work in places where you may be exposed to hepatitis B. Haemophilus influenzae type b (Hib) vaccine  You may need this if you have certain risk factors. Human papillomavirus (HPV) vaccine  If recommended by your health care provider, you may need three doses over 6 months. You may receive vaccines as individual doses or as more than one vaccine together in one shot (combination vaccines). Talk with your health care provider about the risks and benefits of combination vaccines. What tests do I need? Blood tests  Lipid and cholesterol levels. These may be checked every 5 years, or more frequently if you are over 23 years old.  Hepatitis C test.  Hepatitis B test. Screening  Lung cancer screening. You may have this screening every year starting at age 15 if you have a 30-pack-year history of smoking and currently smoke or have quit within the past 15 years.  Prostate cancer screening. Recommendations will vary depending on your family history and other risks.  Colorectal cancer screening. All adults  should have this screening starting at age 17 and continuing until age 57. Your health care provider may recommend screening at age 79 if you are at increased risk. You will have tests every 1-10 years, depending on your results and the type of screening test.  Diabetes screening. This is done by checking your blood sugar (glucose) after you have not eaten for a while (fasting). You may have this done every 1-3  years.  Sexually transmitted disease (STD) testing. Follow these instructions at home: Eating and drinking  Eat a diet that includes fresh fruits and vegetables, whole grains, lean protein, and low-fat dairy products.  Take vitamin and mineral supplements as recommended by your health care provider.  Do not drink alcohol if your health care provider tells you not to drink.  If you drink alcohol: ? Limit how much you have to 0-2 drinks a day. ? Be aware of how much alcohol is in your drink. In the [place] one drink equals one 12 oz bottle of beer (355 mL), one 5 oz glass of wine (148 mL), or one 1 oz glass of hard liquor (44 mL). Lifestyle  Take daily care of your teeth and gums.  Stay active. Exercise for at least 30 minutes on 5 or more days each week.  Do not use any products that contain nicotine or tobacco, such as cigarettes, e-cigarettes, and chewing tobacco. If you need help quitting, ask your health care provider.  If you are sexually active, practice safe sex. Use a condom or other form of protection to prevent STIs (sexually transmitted infections).  Talk with your health care provider about taking a low-dose aspirin every day starting at age 78. What's next?  Go to your health care provider once a year for a well check visit.  Ask your health care provider how often you should have your eyes and teeth checked.  Stay up to date on all vaccines. This information is not intended to replace advice given to you by your health care provider. Make sure you discuss any questions you have with your health care provider. Document Revised: 12/10/2018 Document Reviewed: 12/10/2018 Elsevier Patient Education  2020 [employer].

## 2020-07-15 LAB — BASIC METABOLIC PANEL
BUN: 18 mg/dL (ref 7–25)
CO2: 27 mmol/L (ref 20–32)
Calcium: 9.4 mg/dL (ref 8.6–10.3)
Chloride: 105 mmol/L (ref 98–110)
Creat: 0.99 mg/dL (ref 0.70–1.33)
Glucose, Bld: 127 mg/dL — ABNORMAL HIGH (ref 65–99)
Potassium: 4 mmol/L (ref 3.5–5.3)
Sodium: 140 mmol/L (ref 135–146)

## 2020-07-15 LAB — CBC WITH DIFFERENTIAL/PLATELET
Absolute Monocytes: 341 cells/uL (ref 200–950)
Basophils Absolute: 22 cells/uL (ref 0–200)
Basophils Relative: 0.4 %
Eosinophils Absolute: 88 cells/uL (ref 15–500)
Eosinophils Relative: 1.6 %
HCT: 46.8 % (ref 38.5–50.0)
Hemoglobin: 15.1 g/dL (ref 13.2–17.1)
Lymphs Abs: 1782 cells/uL (ref 850–3900)
MCH: 29.3 pg (ref 27.0–33.0)
MCHC: 32.3 g/dL (ref 32.0–36.0)
MCV: 90.7 fL (ref 80.0–100.0)
MPV: 11 fL (ref 7.5–12.5)
Monocytes Relative: 6.2 %
Neutro Abs: 3267 cells/uL (ref 1500–7800)
Neutrophils Relative %: 59.4 %
Platelets: 200 10*3/uL (ref 140–400)
RBC: 5.16 10*6/uL (ref 4.20–5.80)
RDW: 13.2 % (ref 11.0–15.0)
Total Lymphocyte: 32.4 %
WBC: 5.5 10*3/uL (ref 3.8–10.8)

## 2020-07-15 LAB — HEPATIC FUNCTION PANEL
AG Ratio: 1.8 (calc) (ref 1.0–2.5)
ALT: 15 U/L (ref 9–46)
AST: 21 U/L (ref 10–35)
Albumin: 4.4 g/dL (ref 3.6–5.1)
Alkaline phosphatase (APISO): 53 U/L (ref 35–144)
Bilirubin, Direct: 0.1 mg/dL (ref 0.0–0.2)
Globulin: 2.4 g/dL (calc) (ref 1.9–3.7)
Indirect Bilirubin: 0.7 mg/dL (calc) (ref 0.2–1.2)
Total Bilirubin: 0.8 mg/dL (ref 0.2–1.2)
Total Protein: 6.8 g/dL (ref 6.1–8.1)

## 2020-07-15 LAB — PSA: PSA: 1.3 ng/mL (ref <=4.0)

## 2020-07-15 LAB — LIPID PANEL
Cholesterol: 255 mg/dL — ABNORMAL HIGH (ref <200)
HDL: 57 mg/dL (ref >=40)
LDL Cholesterol (Calc): 174 mg/dL (calc) — ABNORMAL HIGH
Non-HDL Cholesterol (Calc): 198 mg/dL (calc) — ABNORMAL HIGH (ref <130)
Total CHOL/HDL Ratio: 4.5 (calc) (ref <5.0)
Triglycerides: 115 mg/dL (ref <150)

## 2020-07-15 LAB — HEMOGLOBIN A1C
Hgb A1c MFr Bld: 6.7 % of total Hgb — ABNORMAL HIGH (ref <5.7)
Mean Plasma Glucose: 146 (calc)
eAG (mmol/L): 8.1 (calc)

## 2020-07-15 LAB — MICROALBUMIN / CREATININE URINE RATIO
Creatinine, Urine: 234 mg/dL (ref 20–320)
Microalb Creat Ratio: 4 mcg/mg creat (ref <30)
Microalb, Ur: 0.9 mg/dL

## 2020-07-15 LAB — TSH: TSH: 1.78 mIU/L (ref 0.40–4.50)

## 2020-07-17 NOTE — Progress Notes (Signed)
Cholesterol up as expected off medication   Hg a1c is slightly up  again   Recheck lipid panel and hg a1c in 3 months   and then decide on   fu visit   Let us know  about bp readings when back on medication .

## 2020-07-18 ENCOUNTER — Other Ambulatory Visit: Payer: Self-pay

## 2020-07-18 DIAGNOSIS — E7800 Pure hypercholesterolemia, unspecified: Secondary | ICD-10-CM

## 2020-07-18 DIAGNOSIS — R7309 Other abnormal glucose: Secondary | ICD-10-CM

## 2020-10-09 ENCOUNTER — Encounter: Payer: Self-pay | Admitting: Internal Medicine

## 2020-10-09 ENCOUNTER — Ambulatory Visit: Payer: BC Managed Care – PPO | Admitting: Internal Medicine

## 2020-10-09 ENCOUNTER — Other Ambulatory Visit: Payer: Self-pay

## 2020-10-09 VITALS — BP 132/84 | HR 60 | Temp 98.1°F | Resp 16 | Ht 70.87 in | Wt 200.6 lb

## 2020-10-09 DIAGNOSIS — R7309 Other abnormal glucose: Secondary | ICD-10-CM | POA: Diagnosis not present

## 2020-10-09 DIAGNOSIS — Z79899 Other long term (current) drug therapy: Secondary | ICD-10-CM

## 2020-10-09 DIAGNOSIS — I10 Essential (primary) hypertension: Secondary | ICD-10-CM | POA: Diagnosis not present

## 2020-10-09 DIAGNOSIS — Z23 Encounter for immunization: Secondary | ICD-10-CM

## 2020-10-09 DIAGNOSIS — E785 Hyperlipidemia, unspecified: Secondary | ICD-10-CM | POA: Diagnosis not present

## 2020-10-09 NOTE — Telephone Encounter (Signed)
Addressed at  Visit

## 2020-10-09 NOTE — Patient Instructions (Addendum)
Will notify you  of labs when available.  For now  bp seems  Controlled.   Same med dosing .   Plan  ROV in 5 months   If all ok .   For follow.  Call for lab  . appt  And orders  Pre visit .

## 2020-10-09 NOTE — Progress Notes (Signed)
Chief Complaint  Patient presents with  . Blood pressure    pt would like to discuss blood pressure    HPI: David Copeland 59 y.o. come in for Chronic disease management  See last visit  PV  Back on statin medication ( had run out)  BP  Now on low  dose  5 mg lisinopril and has readings update  Most 120/80 range ocass 130 range  Rare   140 see my chart message readings    Feels well.  No new sx  ROS: See pertinent positives and negatives per HPI.  Past Medical History:  Diagnosis Date  . History of diverticulitis of colon 8/05  . Hyperglycemia   . Hyperlipidemia   . Hypertension    off med after lifestyle intervention contolled    Family History  Problem Relation Age of Onset  . Stroke Father        in hot weather  . Breast cancer Mother   . Stroke Other   . Arthritis Neg Hx     Social History   Socioeconomic History  . Marital status: Married    Spouse name: Not on file  . Number of children: Not on file  . Years of education: Not on file  . Highest education level: Not on file  Occupational History  . Not on file  Tobacco Use  . Smoking status: Never Smoker  . Smokeless tobacco: Never Used  Vaping Use  . Vaping Use: Never used  Substance and Sexual Activity  . Alcohol use: Yes    Comment: occasional  . Drug use: No  . Sexual activity: Not on file  Other Topics Concern  . Not on file  Social History Narrative   Married with children   hhof 5   2 dogs    Former smoker   Regular exercise-yes  does marathon    Is [place]   No est. Runs   Sleep adequate   40- 50 jours per week.                  Social Determinants of Health   Financial Resource Strain:   . Difficulty of Paying Living Expenses: Not on file  Food Insecurity:   . Worried About Charity fundraiser in the Last Year: Not on file  . Ran Out of Food in the Last Year: Not on file  Transportation Needs:   . Lack of Transportation (Medical): Not on file  . Lack of  Transportation (Non-Medical): Not on file  Physical Activity:   . Days of Exercise per Week: Not on file  . Minutes of Exercise per Session: Not on file  Stress:   . Feeling of Stress : Not on file  Social Connections:   . Frequency of Communication with Friends and Family: Not on file  . Frequency of Social Gatherings with Friends and Family: Not on file  . Attends Religious Services: Not on file  . Active Member of Clubs or Organizations: Not on file  . Attends Archivist Meetings: Not on file  . Marital Status: Not on file    Outpatient Medications Prior to Visit  Medication Sig Dispense Refill  . aspirin 81 MG tablet Take 81 mg by mouth daily.      Marland Kitchen lisinopril (ZESTRIL) 5 MG tablet Take 1 tablet (5 mg total) by mouth daily. 90 tablet 3  . metFORMIN (GLUCOPHAGE) 500 MG tablet TAKE 4 TABLETS DAILY 360 tablet 3  .  rosuvastatin (CRESTOR) 20 MG tablet Take 1 tablet (20 mg total) by mouth daily. 90 tablet 3   No facility-administered medications prior to visit.     EXAM:  BP 132/84 (BP Location: Left Arm, Patient Position: Sitting)   Pulse 60   Temp 98.1 F (36.7 C)   Resp 16   Ht 5' 10.87" (1.8 m)   Wt 200 lb 9.6 oz (91 kg)   SpO2 96%   BMI 28.08 kg/m   Body mass index is 28.08 kg/m.  GENERAL: vitals reviewed and listed above, alert, oriented, appears well hydrated and in no acute distress HEENT: atraumatic, conjunctiva  clear, no obvious abnormalities on inspection of external nose and ears OP : masked e  NECK: no obvious masses on inspection palpation ntll  MS: moves all extremities without noticeable focal  abnormality PSYCH: pleasant and cooperative, no obvious depression or anxiety Lab Results  Component Value Date   WBC 5.5 07/14/2020   HGB 15.1 07/14/2020   HCT 46.8 07/14/2020   PLT 200 07/14/2020   GLUCOSE 127 (H) 07/14/2020   CHOL 159 10/09/2020   TRIG 82 10/09/2020   HDL 64 10/09/2020   LDLCALC 79 10/09/2020   ALT 15 07/14/2020   AST 21  07/14/2020   NA 140 07/14/2020   K 4.0 07/14/2020   CL 105 07/14/2020   CREATININE 0.99 07/14/2020   BUN 18 07/14/2020   CO2 27 07/14/2020   TSH 1.78 07/14/2020   PSA 1.3 07/14/2020   HGBA1C 6.4 (H) 10/09/2020   MICROALBUR 0.9 07/14/2020   BP Readings from Last 3 Encounters:  10/09/20 132/84  07/14/20 (!) 148/86  10/29/18 130/68    ASSESSMENT AND PLAN:  Discussed the following assessment and plan:  Essential hypertension  Hyperlipidemia, unspecified hyperlipidemia type - Plan: Lipid panel, Lipid panel  Medication management - Plan: Lipid panel, Lipid panel  Need for Td vaccine - Plan: Td : Tetanus/diphtheria >7yo Preservative  free  Elevated hemoglobin A1c - Plan: Hemoglobin A1c   Fu lab back on meds   Stay on low dose acei for now  Better control  . crestor  Plan about 5 mos  Check  Call for lab pre visit   Orders .   -Patient advised to return or notify health care team  if  new concerns arise.  Patient Instructions  Will notify you  of labs when available.  For now  bp seems  Controlled.   Same med dosing .   Plan  ROV in 5 months   If all ok .   For follow.  Call for lab  . appt  And orders  Pre visit .    Standley Brooking. Drayk Humbarger M.D.

## 2020-10-10 LAB — HEMOGLOBIN A1C
Hgb A1c MFr Bld: 6.4 % of total Hgb — ABNORMAL HIGH (ref <5.7)
Mean Plasma Glucose: 137 (calc)
eAG (mmol/L): 7.6 (calc)

## 2020-10-10 LAB — LIPID PANEL
Cholesterol: 159 mg/dL (ref <200)
HDL: 64 mg/dL (ref >=40)
LDL Cholesterol (Calc): 79 mg/dL (calc)
Non-HDL Cholesterol (Calc): 95 mg/dL (calc) (ref <130)
Total CHOL/HDL Ratio: 2.5 (calc) (ref <5.0)
Triglycerides: 82 mg/dL (ref <150)

## 2020-10-10 NOTE — Progress Notes (Signed)
Lipids much better at goal  back on medication Continue   medication

## 2021-01-23 ENCOUNTER — Encounter: Payer: Self-pay | Admitting: Internal Medicine

## 2021-03-09 ENCOUNTER — Ambulatory Visit: Payer: BC Managed Care – PPO | Admitting: Internal Medicine

## 2021-03-12 ENCOUNTER — Other Ambulatory Visit: Payer: Self-pay

## 2021-03-12 ENCOUNTER — Ambulatory Visit: Payer: BC Managed Care – PPO | Admitting: Internal Medicine

## 2021-03-12 ENCOUNTER — Encounter: Payer: Self-pay | Admitting: Internal Medicine

## 2021-03-12 VITALS — BP 130/80 | HR 55 | Temp 97.6°F | Ht 70.87 in | Wt 200.8 lb

## 2021-03-12 DIAGNOSIS — Z0000 Encounter for general adult medical examination without abnormal findings: Secondary | ICD-10-CM

## 2021-03-12 DIAGNOSIS — Z125 Encounter for screening for malignant neoplasm of prostate: Secondary | ICD-10-CM | POA: Diagnosis not present

## 2021-03-12 DIAGNOSIS — I10 Essential (primary) hypertension: Secondary | ICD-10-CM

## 2021-03-12 DIAGNOSIS — Z79899 Other long term (current) drug therapy: Secondary | ICD-10-CM | POA: Diagnosis not present

## 2021-03-12 DIAGNOSIS — E785 Hyperlipidemia, unspecified: Secondary | ICD-10-CM

## 2021-03-12 DIAGNOSIS — E119 Type 2 diabetes mellitus without complications: Secondary | ICD-10-CM | POA: Diagnosis not present

## 2021-03-12 LAB — POCT GLYCOSYLATED HEMOGLOBIN (HGB A1C): Hemoglobin A1C: 6.8 % — AB (ref 4.0–5.6)

## 2021-03-12 NOTE — Progress Notes (Signed)
Chief Complaint  Patient presents with  . Follow-up    HPI: David Copeland 60 y.o. come in for Chronic disease management  Since last visit no change in his health continues 5 hours of exercise a day taking medication Metformin and blood pressure lipid. Blood pressure readings tend to be 123/83 range or average. No change in vision chest pain shortness of breath neurologic symptoms. Last eye exam about a year ago. ROS: See pertinent positives and negatives per HPI.  Past Medical History:  Diagnosis Date  . History of diverticulitis of colon 8/05  . Hyperglycemia   . Hyperlipidemia   . Hypertension    off med after lifestyle intervention contolled    Family History  Problem Relation Age of Onset  . Stroke Father        in hot weather  . Breast cancer Mother   . Stroke Other   . Arthritis Neg Hx     Social History   Socioeconomic History  . Marital status: Married    Spouse name: Not on file  . Number of children: Not on file  . Years of education: Not on file  . Highest education level: Not on file  Occupational History  . Not on file  Tobacco Use  . Smoking status: Never Smoker  . Smokeless tobacco: Never Used  Vaping Use  . Vaping Use: Never used  Substance and Sexual Activity  . Alcohol use: Yes    Comment: occasional  . Drug use: No  . Sexual activity: Not on file  Other Topics Concern  . Not on file  Social History Narrative   Married with children   hhof 5   2 dogs    Former smoker   Regular exercise-yes  does marathon    Is [place]   No est. Runs   Sleep adequate   40- 50 jours per week.                  Social Determinants of Health   Financial Resource Strain: Not on file  Food Insecurity: Not on file  Transportation Needs: Not on file  Physical Activity: Not on file  Stress: Not on file  Social Connections: Not on file    Outpatient Medications Prior to Visit  Medication Sig Dispense Refill  . aspirin 81 MG  tablet Take 81 mg by mouth daily.    Marland Kitchen lisinopril (ZESTRIL) 5 MG tablet Take 1 tablet (5 mg total) by mouth daily. 90 tablet 3  . metFORMIN (GLUCOPHAGE) 500 MG tablet TAKE 4 TABLETS DAILY 360 tablet 3  . rosuvastatin (CRESTOR) 20 MG tablet Take 1 tablet (20 mg total) by mouth daily. 90 tablet 3   No facility-administered medications prior to visit.     EXAM:  BP 130/80 (BP Location: Left Arm, Patient Position: Sitting, Cuff Size: Large)   Pulse (!) 55   Temp 97.6 F (36.4 C) (Oral)   Ht 5' 10.87" (1.8 m)   Wt 200 lb 12.8 oz (91.1 kg)   SpO2 99%   BMI 28.11 kg/m   Body mass index is 28.11 kg/m.  GENERAL: vitals reviewed and listed above, alert, oriented, appears well hydrated and in no acute distress HEENT: atraumatic, conjunctiva  clear, no obvious abnormalities on inspection of external nose and ears OP : Masked No respiratory distress looks well MS: moves all extremities without noticeable focal  abnormality PSYCH: pleasant and cooperative, no obvious depression or anxiety Lab Results  Component Value  Date   WBC 5.5 07/14/2020   HGB 15.1 07/14/2020   HCT 46.8 07/14/2020   PLT 200 07/14/2020   GLUCOSE 127 (H) 07/14/2020   CHOL 159 10/09/2020   TRIG 82 10/09/2020   HDL 64 10/09/2020   LDLCALC 79 10/09/2020   ALT 15 07/14/2020   AST 21 07/14/2020   NA 140 07/14/2020   K 4.0 07/14/2020   CL 105 07/14/2020   CREATININE 0.99 07/14/2020   BUN 18 07/14/2020   CO2 27 07/14/2020   TSH 1.78 07/14/2020   PSA 1.3 07/14/2020   HGBA1C 6.8 (A) 03/12/2021   MICROALBUR 0.9 07/14/2020   BP Readings from Last 3 Encounters:  03/12/21 130/80  10/09/20 132/84  07/14/20 (!) 148/86   Wt Readings from Last 3 Encounters:  03/12/21 200 lb 12.8 oz (91.1 kg)  10/09/20 200 lb 9.6 oz (91 kg)  07/14/20 202 lb 9.6 oz (91.9 kg)    ASSESSMENT AND PLAN:  Discussed the following assessment and plan:  Essential hypertension  Medication management  Diabetes mellitus without  complication ([place]) - Plan: POCT glycosylated hemoglobin (Hb A1C) A1c up slightly from past visit continue monitoring medication lifestyle. Plan CPX full panel lab work July August when due for physical.  Can be done before visit. -Patient advised to return or notify health care team  if  new concerns arise.  Patient Instructions  Blood pressure readings by report or at goal Continue healthy lifestyle ,medication  Hg a1c I 6.8  Eye exam when due Physical with labs July august    Lyndee Herbst K. Dyna Figuereo M.D.

## 2021-03-12 NOTE — Patient Instructions (Addendum)
Blood pressure readings by report or at goal Continue healthy lifestyle ,medication  Hg a1c I 6.8  Eye exam when due Physical with labs July august

## 2021-08-05 ENCOUNTER — Other Ambulatory Visit: Payer: Self-pay | Admitting: Internal Medicine

## 2021-08-16 ENCOUNTER — Other Ambulatory Visit (INDEPENDENT_AMBULATORY_CARE_PROVIDER_SITE_OTHER): Payer: BC Managed Care – PPO

## 2021-08-16 ENCOUNTER — Other Ambulatory Visit: Payer: Self-pay

## 2021-08-16 DIAGNOSIS — Z79899 Other long term (current) drug therapy: Secondary | ICD-10-CM | POA: Diagnosis not present

## 2021-08-16 DIAGNOSIS — Z0000 Encounter for general adult medical examination without abnormal findings: Secondary | ICD-10-CM

## 2021-08-16 DIAGNOSIS — E119 Type 2 diabetes mellitus without complications: Secondary | ICD-10-CM | POA: Diagnosis not present

## 2021-08-16 DIAGNOSIS — Z125 Encounter for screening for malignant neoplasm of prostate: Secondary | ICD-10-CM | POA: Diagnosis not present

## 2021-08-16 DIAGNOSIS — I10 Essential (primary) hypertension: Secondary | ICD-10-CM

## 2021-08-16 DIAGNOSIS — E785 Hyperlipidemia, unspecified: Secondary | ICD-10-CM

## 2021-08-16 LAB — HEPATIC FUNCTION PANEL
ALT: 24 U/L (ref 0–53)
AST: 21 U/L (ref 0–37)
Albumin: 4.2 g/dL (ref 3.5–5.2)
Alkaline Phosphatase: 55 U/L (ref 39–117)
Bilirubin, Direct: 0.1 mg/dL (ref 0.0–0.3)
Total Bilirubin: 0.8 mg/dL (ref 0.2–1.2)
Total Protein: 6.8 g/dL (ref 6.0–8.3)

## 2021-08-16 LAB — BASIC METABOLIC PANEL
BUN: 16 mg/dL (ref 6–23)
CO2: 28 mEq/L (ref 19–32)
Calcium: 9.5 mg/dL (ref 8.4–10.5)
Chloride: 104 mEq/L (ref 96–112)
Creatinine, Ser: 1 mg/dL (ref 0.40–1.50)
GFR: 83.37 mL/min (ref >60.00)
Glucose, Bld: 127 mg/dL — ABNORMAL HIGH (ref 70–99)
Potassium: 4.3 mEq/L (ref 3.5–5.1)
Sodium: 140 mEq/L (ref 135–145)

## 2021-08-16 LAB — CBC WITH DIFFERENTIAL/PLATELET
Basophils Absolute: 0 10*3/uL (ref 0.0–0.1)
Basophils Relative: 0.4 % (ref 0.0–3.0)
Eosinophils Absolute: 0.1 10*3/uL (ref 0.0–0.7)
Eosinophils Relative: 1.6 % (ref 0.0–5.0)
HCT: 42.5 % (ref 39.0–52.0)
Hemoglobin: 14.4 g/dL (ref 13.0–17.0)
Lymphocytes Relative: 30.7 % (ref 12.0–46.0)
Lymphs Abs: 1.7 10*3/uL (ref 0.7–4.0)
MCHC: 33.9 g/dL (ref 30.0–36.0)
MCV: 88.3 fl (ref 78.0–100.0)
Monocytes Absolute: 0.3 10*3/uL (ref 0.1–1.0)
Monocytes Relative: 5.9 % (ref 3.0–12.0)
Neutro Abs: 3.3 10*3/uL (ref 1.4–7.7)
Neutrophils Relative %: 61.4 % (ref 43.0–77.0)
Platelets: 198 10*3/uL (ref 150.0–400.0)
RBC: 4.82 Mil/uL (ref 4.22–5.81)
RDW: 13.4 % (ref 11.5–15.5)
WBC: 5.4 10*3/uL (ref 4.0–10.5)

## 2021-08-16 LAB — LIPID PANEL
Cholesterol: 179 mg/dL (ref 0–200)
HDL: 59.1 mg/dL (ref >39.00)
LDL Cholesterol: 97 mg/dL (ref 0–99)
NonHDL: 119.47
Total CHOL/HDL Ratio: 3
Triglycerides: 110 mg/dL (ref 0.0–149.0)
VLDL: 22 mg/dL (ref 0.0–40.0)

## 2021-08-16 LAB — PSA: PSA: 1.68 ng/mL (ref 0.10–4.00)

## 2021-08-16 LAB — HEMOGLOBIN A1C: Hgb A1c MFr Bld: 6.9 % — ABNORMAL HIGH (ref 4.6–6.5)

## 2021-08-17 LAB — MICROALBUMIN / CREATININE URINE RATIO
Creatinine,U: 166 mg/dL
Microalb Creat Ratio: 0.5 mg/g (ref 0.0–30.0)
Microalb, Ur: 0.9 mg/dL (ref 0.0–1.9)

## 2021-08-19 NOTE — Progress Notes (Signed)
Stable results  will review at upcoming  visit

## 2021-08-20 ENCOUNTER — Other Ambulatory Visit: Payer: BC Managed Care – PPO

## 2021-08-23 NOTE — Progress Notes (Deleted)
No chief complaint on file.   HPI: Patient  David Copeland  60 y.o. comes in today for Preventive Health Care visit   Health Maintenance  Topic Date Due   OPHTHALMOLOGY EXAM  Never done   Zoster Vaccines- Shingrix (1 of 2) Never done   Pneumococcal Vaccine 72-22 Years old (2 - PCV) 06/24/2006   COLONOSCOPY (Pts 45-31yr Insurance coverage will need to be confirmed)  Never done   COVID-19 Vaccine (4 - Booster for [employer]) 02/13/2021   FOOT EXAM  07/14/2021   INFLUENZA VACCINE  07/30/2021   HIV Screening  03/12/2022 (Originally 10/24/1976)   HEMOGLOBIN A1C  02/16/2022   TETANUS/TDAP  10/09/2030   PNEUMOCOCCAL POLYSACCHARIDE VACCINE AGE 33-64 HIGH RISK  Completed   Hepatitis C Screening  Completed   HPV VACCINES  Aged Out   Health Maintenance Review LIFESTYLE:  Exercise:   Tobacco/ETS: Alcohol:  Sugar beverages: Sleep: Drug use: no HH of  Work:    ROS:  GEN/ HEENT: No fever, significant weight changes sweats headaches vision problems hearing changes, CV/ PULM; No chest pain shortness of breath cough, syncope,edema  change in exercise tolerance. GI /GU: No adominal pain, vomiting, change in bowel habits. No blood in the stool. No significant GU symptoms. SKIN/HEME: ,no acute skin rashes suspicious lesions or bleeding. No lymphadenopathy, nodules, masses.  NEURO/ PSYCH:  No neurologic signs such as weakness numbness. No depression anxiety. IMM/ Allergy: No unusual infections.  Allergy .   REST of 12 system review negative except as per HPI   Past Medical History:  Diagnosis Date   History of diverticulitis of colon 8/05   Hyperglycemia    Hyperlipidemia    Hypertension    off med after lifestyle intervention contolled    Past Surgical History:  Procedure Laterality Date   ADENOIDECTOMY     removed breast glands     damaged secondary to soccer   TONSILLECTOMY     WISDOM TOOTH EXTRACTION      Family History  Problem Relation Age of Onset    Stroke Father        in hot weather   Breast cancer Mother    Stroke Other    Arthritis Neg Hx     Social History   Socioeconomic History   Marital status: Married    Spouse name: Not on file   Number of children: Not on file   Years of education: Not on file   Highest education level: Not on file  Occupational History   Not on file  Tobacco Use   Smoking status: Never   Smokeless tobacco: Never  Vaping Use   Vaping Use: Never used  Substance and Sexual Activity   Alcohol use: Yes    Comment: occasional   Drug use: No   Sexual activity: Not on file  Other Topics Concern   Not on file  Social History Narrative   Married with children   hhof 5   2 dogs    Former smoker   Regular exercise-yes  does marathon    [place]   No est. Runs   Sleep adequate   40- 50 jours per week.                  Social Determinants of Health   Financial Resource Strain: Not on file  Food Insecurity: Not on file  Transportation Needs: Not on file  Physical Activity: Not on file  Stress: Not on file  Social Connections: Not on file    Outpatient Medications Prior to Visit  Medication Sig Dispense Refill   aspirin 81 MG tablet Take 81 mg by mouth daily.     lisinopril (ZESTRIL) 5 MG tablet TAKE 1 TABLET DAILY 90 tablet 3   metFORMIN (GLUCOPHAGE) 500 MG tablet TAKE 4 TABLETS DAILY 360 tablet 3   rosuvastatin (CRESTOR) 20 MG tablet TAKE 1 TABLET DAILY 90 tablet 3   No facility-administered medications prior to visit.     EXAM:  There were no vitals taken for this visit.  There is no height or weight on file to calculate BMI. Wt Readings from Last 3 Encounters:  03/12/21 200 lb 12.8 oz (91.1 kg)  10/09/20 200 lb 9.6 oz (91 kg)  07/14/20 202 lb 9.6 oz (91.9 kg)    Physical Exam: Vital signs reviewed [id number] is a well-developed well-nourished alert cooperative    who appearsr stated age in no acute distress.  HEENT: normocephalic atraumatic , Eyes: PERRL  EOM's full, conjunctiva clear, Nares: paten,t no deformity discharge or tenderness., Ears: no deformity EAC's clear TMs with normal landmarks. Mouth: clear OP, no lesions, edema.  Moist mucous membranes. Dentition in adequate repair. NECK: supple without masses, thyromegaly or bruits. CHEST/PULM:  Clear to auscultation and percussion breath sounds equal no wheeze , rales or rhonchi. No chest wall deformities or tenderness. Breast: normal by inspection . No dimpling, discharge, masses, tenderness or discharge . CV: PMI is nondisplaced, S1 S2 no gallops, murmurs, rubs. Peripheral pulses are full without delay.No JVD .  ABDOMEN: Bowel sounds normal nontender  No guard or rebound, no hepato splenomegal no CVA tenderness.  No hernia. Extremtities:  No clubbing cyanosis or edema, no acute joint swelling or redness no focal atrophy NEURO:  Oriented x3, cranial nerves 3-12 appear to be intact, no obvious focal weakness,gait within normal limits no abnormal reflexes or asymmetrical SKIN: No acute rashes normal turgor, color, no bruising or petechiae. PSYCH: Oriented, good eye contact, no obvious depression anxiety, cognition and judgment appear normal. LN: no cervical axillary inguinal adenopathy  Lab Results  Component Value Date   WBC 5.4 08/16/2021   HGB 14.4 08/16/2021   HCT 42.5 08/16/2021   PLT 198.0 08/16/2021   GLUCOSE 127 (H) 08/16/2021   CHOL 179 08/16/2021   TRIG 110.0 08/16/2021   HDL 59.10 08/16/2021   LDLCALC 97 08/16/2021   ALT 24 08/16/2021   AST 21 08/16/2021   NA 140 08/16/2021   K 4.3 08/16/2021   CL 104 08/16/2021   CREATININE 1.00 08/16/2021   BUN 16 08/16/2021   CO2 28 08/16/2021   TSH 1.78 07/14/2020   PSA 1.68 08/16/2021   HGBA1C 6.9 (H) 08/16/2021   MICROALBUR 0.9 08/16/2021    BP Readings from Last 3 Encounters:  03/12/21 130/80  10/09/20 132/84  07/14/20 (!) 148/86    Lab results reviewed with patient   ASSESSMENT AND PLAN:  Discussed the following  assessment and plan:    ICD-10-CM   1. Encounter for preventive health examination  Z00.00     2. Medication management  Z79.899     3. Hyperlipidemia, unspecified hyperlipidemia type  E78.5     4. Essential hypertension  I10     5. Screening PSA (prostate specific antigen)  Z12.5     6. Diabetes mellitus without complication (HCC)  [id number]      No follow-ups on file.  Patient Care Team: Burnis Medin, MD as PCP - Mackie Pai, [place],  MD as Consulting Physician (Gastroenterology) There are no Patient Instructions on file for this visit.  Standley Brooking. Crosby Oriordan M.D.

## 2021-08-27 ENCOUNTER — Telehealth: Payer: Self-pay | Admitting: Internal Medicine

## 2021-08-27 ENCOUNTER — Encounter: Payer: BC Managed Care – PPO | Admitting: Internal Medicine

## 2021-08-27 DIAGNOSIS — I10 Essential (primary) hypertension: Secondary | ICD-10-CM

## 2021-08-27 DIAGNOSIS — Z79899 Other long term (current) drug therapy: Secondary | ICD-10-CM

## 2021-08-27 DIAGNOSIS — Z0000 Encounter for general adult medical examination without abnormal findings: Secondary | ICD-10-CM

## 2021-08-27 DIAGNOSIS — E785 Hyperlipidemia, unspecified: Secondary | ICD-10-CM

## 2021-08-27 DIAGNOSIS — E119 Type 2 diabetes mellitus without complications: Secondary | ICD-10-CM

## 2021-08-27 NOTE — Telephone Encounter (Signed)
Patient has tested positive for covid, he has already had labs done and needs a physical appointment within the next 30 days. I let him know someone from [facility] would call him back to get him scheduled

## 2021-08-27 NOTE — Telephone Encounter (Signed)
I spoke with the pt and rescheduled his appt for 09/11/2021. Pt was offered virtual visit for Covid but declined and stated that he was ok. Pt has been informed to contact our office if he would like to see a provider in regards to his Covid infection.

## 2021-09-11 ENCOUNTER — Other Ambulatory Visit: Payer: Self-pay

## 2021-09-11 ENCOUNTER — Ambulatory Visit (INDEPENDENT_AMBULATORY_CARE_PROVIDER_SITE_OTHER): Payer: BC Managed Care – PPO | Admitting: Internal Medicine

## 2021-09-11 ENCOUNTER — Encounter: Payer: Self-pay | Admitting: Internal Medicine

## 2021-09-11 VITALS — BP 136/70 | HR 63 | Temp 98.5°F | Ht 71.0 in | Wt 202.2 lb

## 2021-09-11 DIAGNOSIS — Z79899 Other long term (current) drug therapy: Secondary | ICD-10-CM

## 2021-09-11 DIAGNOSIS — Z0000 Encounter for general adult medical examination without abnormal findings: Secondary | ICD-10-CM

## 2021-09-11 DIAGNOSIS — Z23 Encounter for immunization: Secondary | ICD-10-CM | POA: Diagnosis not present

## 2021-09-11 DIAGNOSIS — Z8616 Personal history of COVID-19: Secondary | ICD-10-CM

## 2021-09-11 DIAGNOSIS — E119 Type 2 diabetes mellitus without complications: Secondary | ICD-10-CM

## 2021-09-11 DIAGNOSIS — I10 Essential (primary) hypertension: Secondary | ICD-10-CM | POA: Diagnosis not present

## 2021-09-11 DIAGNOSIS — E785 Hyperlipidemia, unspecified: Secondary | ICD-10-CM

## 2021-09-11 DIAGNOSIS — R7301 Impaired fasting glucose: Secondary | ICD-10-CM

## 2021-09-11 NOTE — Progress Notes (Signed)
Chief Complaint  Patient presents with   Annual Exam   Medication Management     HPI: Patient  David Copeland  60 y.o. comes in today for Preventive Health Care visit  And Chronic disease management BP 123/83  range 130  HLD taking meds  DM/BG : misses afternoon  dose about 40 % at times  nocv pulm sx nueropathy  due for colon  Had covid 19 end of  August   as well as wife HA and fatigue   better  now  Health Maintenance  Topic Date Due   OPHTHALMOLOGY EXAM  Never done   Zoster Vaccines- Shingrix (1 of 2) Never done   Pneumococcal Vaccine 50-2 Years old (2 - PCV) 06/24/2006   COLONOSCOPY (Pts 45-19yr Insurance coverage will need to be confirmed)  Never done   COVID-19 Vaccine (4 - Booster for PR[facility]ublic[employer]) 02/05/2021   HIV Screening  03/12/2022 (Originally 10/24/1976)   HEMOGLOBIN A1C  02/16/2022   FOOT EXAM  09/11/2022   TETANUS/TDAP  10/09/2030   INFLUENZA VACCINE  Completed   PNEUMOCOCCAL POLYSACCHARIDE VACCINE AGE 4-64 HIGH RISK  Completed   Hepatitis C Screening  Completed   HPV VACCINES  Aged Out   Health Maintenance Review LIFESTYLE:  Exercise:  5.5 per week.  Running.  Tobacco/ETS: n Alcohol:   6 per week  Sugar beverages: no Sleep: 7 hours  Drug use: no HH of  Work:50   ROS:  REST of 12 system review negative except as per HPI nocturia x 1    Past Medical History:  Diagnosis Date   History of diverticulitis of colon 8/05   Hyperglycemia    Hyperlipidemia    Hypertension    off med after lifestyle intervention contolled    Past Surgical History:  Procedure Laterality Date   ADENOIDECTOMY     removed breast glands     damaged secondary to soccer   TONSILLECTOMY     WISDOM TOOTH EXTRACTION      Family History  Problem Relation Age of Onset   Stroke Father        in hot weather   Breast cancer Mother    Stroke Other    Arthritis Neg Hx     Social History   Socioeconomic History   Marital status: Married    Spouse name: Not  on file   Number of children: Not on file   Years of education: Not on file   Highest education level: Not on file  Occupational History   Not on file  Tobacco Use   Smoking status: Never   Smokeless tobacco: Never  Vaping Use   Vaping Use: Never used  Substance and Sexual Activity   Alcohol use: Yes    Comment: occasional   Drug use: No   Sexual activity: Not on file  Other Topics Concern   Not on file  Social History Narrative   Married with children   hhof 5   2 dogs    Former smoker   Regular exercise-yes  does marathon    [place]   No est. Runs   Sleep adequate   40- 50 jours per week.                  Social Determinants of Health   Financial Resource Strain: Not on file  Food Insecurity: Not on file  Transportation Needs: Not on file  Physical Activity: Not on file  Stress: Not on  file  Social Connections: Not on file    Outpatient Medications Prior to Visit  Medication Sig Dispense Refill   aspirin 81 MG tablet Take 81 mg by mouth daily.     lisinopril (ZESTRIL) 5 MG tablet TAKE 1 TABLET DAILY 90 tablet 3   metFORMIN (GLUCOPHAGE) 500 MG tablet TAKE 4 TABLETS DAILY 360 tablet 3   rosuvastatin (CRESTOR) 20 MG tablet TAKE 1 TABLET DAILY 90 tablet 3   No facility-administered medications prior to visit.     EXAM:  BP 136/70 (BP Location: Left Arm, Patient Position: Sitting, Cuff Size: Normal)   Pulse 63   Temp 98.5 F (36.9 C) (Oral)   Ht '5\' 11"'$  (1.803 m)   Wt 202 lb 3.2 oz (91.7 kg)   SpO2 97%   BMI 28.20 kg/m   Body mass index is 28.2 kg/m. Wt Readings from Last 3 Encounters:  09/11/21 202 lb 3.2 oz (91.7 kg)  03/12/21 200 lb 12.8 oz (91.1 kg)  10/09/20 200 lb 9.6 oz (91 kg)    Physical Exam: Vital signs reviewed [id number] is a well-developed well-nourished alert cooperative    who appearsr stated age in no acute distress.  HEENT: normocephalic atraumatic , Eyes: PERRL EOM's full, conjunctiva clear, discharge or  tenderness., Ears: no deformity EAC's clear TMs with normal landmarks. Mouth: masked  CHEST/PULM:  Clear to auscultation and percussion breath sounds equal no wheeze , rales or rhonchi. No chest wall deformities or tenderness. CV: PMI is nondisplaced, S1 S2 no gallops, murmurs, rubs. Peripheral pulses are full without delay.No JVD .  ABDOMEN: Bowel sounds normal nontender  No guard or rebound, no hepato splenomegal no CVA tenderness.   Extremtities:  No clubbing cyanosis or edema, no acute joint swelling or redness no focal atrophy toenails thickened  pulsed intact  NEURO:  Oriented x3, cranial nerves 3-12 appear to be intact, no obvious focal weakness,gait within normal limits no abnormal reflexes or asymmetrical SKIN: No acute rashes normal turgor, color, no bruising or petechiae. PSYCH: Oriented, good eye contact, no obvious depression anxiety, cognition and judgment appear normal. LN: no cervical axillary inguinal adenopathy Diabetic Foot Exam - Simple   Simple Foot Form Diabetic Foot exam was performed with the following findings: Yes 09/11/2021  2:57 PM  Visual Inspection No deformities, no ulcerations, no other skin breakdown bilaterally: Yes See comments: Yes Sensation Testing Intact to touch and monofilament testing bilaterally: Yes Pulse Check Posterior Tibialis and Dorsalis pulse intact bilaterally: Yes Comments Thickened toenails       Lab Results  Component Value Date   WBC 5.4 08/16/2021   HGB 14.4 08/16/2021   HCT 42.5 08/16/2021   PLT 198.0 08/16/2021   GLUCOSE 127 (H) 08/16/2021   CHOL 179 08/16/2021   TRIG 110.0 08/16/2021   HDL 59.10 08/16/2021   LDLCALC 97 08/16/2021   ALT 24 08/16/2021   AST 21 08/16/2021   NA 140 08/16/2021   K 4.3 08/16/2021   CL 104 08/16/2021   CREATININE 1.00 08/16/2021   BUN 16 08/16/2021   CO2 28 08/16/2021   TSH 1.78 07/14/2020   PSA 1.68 08/16/2021   HGBA1C 6.9 (H) 08/16/2021   MICROALBUR 0.9 08/16/2021    BP Readings from  Last 3 Encounters:  09/11/21 136/70  03/12/21 130/80  10/09/20 132/84    Lab results reviewed with patient   ASSESSMENT AND PLAN:  Discussed the following assessment and plan:    ICD-10-CM   1. Encounter for preventive health examination  Z00.00  2. Essential hypertension  I10     3. Diabetes mellitus with coincident hypertension ([place])  E11.9    I10     4. Impaired fasting glucose  R73.01     5. Medication management  Z79.899     6. Hyperlipidemia, unspecified hyperlipidemia type  E78.5     7. Need for influenza vaccination  Z23 Flu Vaccine QUAD 6+ mos PF IM (Fluarix Quad PF)    8. History of COVID-19  Z86.16     Get colonoscopy.  Due  Disc future options sglt2i and glp 1  avoid in dehydration   Disc    goals  Flu immuniz today  Return in about 4 months (around 01/11/2022) for a1c at visit .  Patient Care Team: Verniece Encarnacion, Standley Brooking, MD as PCP - General Juanita Craver, MD as Consulting Physician (Gastroenterology) Patient Instructions  Good to see you today . Continue lifestyle intervention healthy eating and exercise .   ROV at around 4 months   we can do a1c in office  and decide if intensification of medication is  indicated.   Get your colonoscopy  screening .   Standley Brooking. Temara Lanum M.D.

## 2021-09-11 NOTE — Patient Instructions (Signed)
Good to see you today . Continue lifestyle intervention healthy eating and exercise .   ROV at around 4 months   we can do a1c in office  and decide if intensification of medication is  indicated.   Get your colonoscopy  screening .

## 2021-11-13 DIAGNOSIS — M79605 Pain in left leg: Secondary | ICD-10-CM | POA: Diagnosis not present

## 2021-11-13 DIAGNOSIS — M545 Low back pain, unspecified: Secondary | ICD-10-CM | POA: Diagnosis not present

## 2021-12-03 DIAGNOSIS — M545 Low back pain, unspecified: Secondary | ICD-10-CM | POA: Diagnosis not present

## 2021-12-03 DIAGNOSIS — M5410 Radiculopathy, site unspecified: Secondary | ICD-10-CM | POA: Diagnosis not present

## 2021-12-05 DIAGNOSIS — M5410 Radiculopathy, site unspecified: Secondary | ICD-10-CM | POA: Diagnosis not present

## 2021-12-05 DIAGNOSIS — M545 Low back pain, unspecified: Secondary | ICD-10-CM | POA: Diagnosis not present

## 2021-12-12 DIAGNOSIS — M5410 Radiculopathy, site unspecified: Secondary | ICD-10-CM | POA: Diagnosis not present

## 2021-12-12 DIAGNOSIS — M545 Low back pain, unspecified: Secondary | ICD-10-CM | POA: Diagnosis not present

## 2021-12-14 DIAGNOSIS — M5410 Radiculopathy, site unspecified: Secondary | ICD-10-CM | POA: Diagnosis not present

## 2021-12-14 DIAGNOSIS — M545 Low back pain, unspecified: Secondary | ICD-10-CM | POA: Diagnosis not present

## 2021-12-17 DIAGNOSIS — M5410 Radiculopathy, site unspecified: Secondary | ICD-10-CM | POA: Diagnosis not present

## 2021-12-17 DIAGNOSIS — M545 Low back pain, unspecified: Secondary | ICD-10-CM | POA: Diagnosis not present

## 2021-12-20 DIAGNOSIS — M79605 Pain in left leg: Secondary | ICD-10-CM | POA: Diagnosis not present

## 2021-12-20 DIAGNOSIS — M545 Low back pain, unspecified: Secondary | ICD-10-CM | POA: Diagnosis not present

## 2021-12-21 DIAGNOSIS — M5410 Radiculopathy, site unspecified: Secondary | ICD-10-CM | POA: Diagnosis not present

## 2021-12-21 DIAGNOSIS — M545 Low back pain, unspecified: Secondary | ICD-10-CM | POA: Diagnosis not present

## 2021-12-26 DIAGNOSIS — M545 Low back pain, unspecified: Secondary | ICD-10-CM | POA: Diagnosis not present

## 2021-12-26 DIAGNOSIS — M5410 Radiculopathy, site unspecified: Secondary | ICD-10-CM | POA: Diagnosis not present

## 2022-01-02 DIAGNOSIS — M5410 Radiculopathy, site unspecified: Secondary | ICD-10-CM | POA: Diagnosis not present

## 2022-01-02 DIAGNOSIS — M545 Low back pain, unspecified: Secondary | ICD-10-CM | POA: Diagnosis not present

## 2022-01-08 DIAGNOSIS — M5410 Radiculopathy, site unspecified: Secondary | ICD-10-CM | POA: Diagnosis not present

## 2022-01-08 DIAGNOSIS — M545 Low back pain, unspecified: Secondary | ICD-10-CM | POA: Diagnosis not present

## 2022-01-11 DIAGNOSIS — M5410 Radiculopathy, site unspecified: Secondary | ICD-10-CM | POA: Diagnosis not present

## 2022-01-11 DIAGNOSIS — M545 Low back pain, unspecified: Secondary | ICD-10-CM | POA: Diagnosis not present

## 2022-01-14 ENCOUNTER — Ambulatory Visit: Payer: BC Managed Care – PPO | Admitting: Internal Medicine

## 2022-01-14 ENCOUNTER — Encounter: Payer: Self-pay | Admitting: Internal Medicine

## 2022-01-14 VITALS — BP 124/80 | HR 83 | Temp 98.5°F | Ht 71.0 in | Wt 208.4 lb

## 2022-01-14 DIAGNOSIS — Z79899 Other long term (current) drug therapy: Secondary | ICD-10-CM

## 2022-01-14 DIAGNOSIS — Z8616 Personal history of COVID-19: Secondary | ICD-10-CM | POA: Diagnosis not present

## 2022-01-14 DIAGNOSIS — E119 Type 2 diabetes mellitus without complications: Secondary | ICD-10-CM | POA: Diagnosis not present

## 2022-01-14 DIAGNOSIS — I10 Essential (primary) hypertension: Secondary | ICD-10-CM

## 2022-01-14 LAB — POCT GLYCOSYLATED HEMOGLOBIN (HGB A1C): Hemoglobin A1C: 8.8 % — AB (ref 4.0–5.6)

## 2022-01-14 MED ORDER — DAPAGLIFLOZIN PROPANEDIOL 5 MG PO TABS: 5.0000 mg | ORAL_TABLET | Freq: Every day | ORAL | 3 refills | Status: DC

## 2022-01-14 NOTE — Progress Notes (Signed)
No chief complaint on file.   HPI: David Copeland 61 y.o. come in for Chronic disease management  Follow-up of mostly controlled diabetes but trending up. Since last visit he has had a back injury or predicament that caused decreased exercise and a few pounds weight gain he is just getting back into gentle running.  Continues his metformin Currently not monitoring blood sugar otherwise. His last A1c was 6.9 about 4 months ago months ago Bp other controlled and no other health change  ROS: See pertinent positives and negatives per HPI.  Past Medical History:  Diagnosis Date   History of diverticulitis of colon 8/05   Hyperglycemia    Hyperlipidemia    Hypertension    off med after lifestyle intervention contolled    Family History  Problem Relation Age of Onset   Stroke Father        in hot weather   Breast cancer Mother    Stroke Other    Arthritis Neg Hx     Social History   Socioeconomic History   Marital status: Married    Spouse name: Not on file   Number of children: Not on file   Years of education: Not on file   Highest education level: Not on file  Occupational History   Not on file  Tobacco Use   Smoking status: Never   Smokeless tobacco: Never  Vaping Use   Vaping Use: Never used  Substance and Sexual Activity   Alcohol use: Yes    Comment: occasional   Drug use: No   Sexual activity: Not on file  Other Topics Concern   Not on file  Social History Narrative   Married with children   hhof 5   2 dogs    Former smoker   Regular exercise-yes  does marathon    Is [place]   No est. Runs   Sleep adequate   40- 50 jours per week.                  Social Determinants of Health   Financial Resource Strain: Not on file  Food Insecurity: Not on file  Transportation Needs: Not on file  Physical Activity: Not on file  Stress: Not on file  Social Connections: Not on file    Outpatient Medications Prior to Visit  Medication  Sig Dispense Refill   aspirin 81 MG tablet Take 81 mg by mouth daily.     lisinopril (ZESTRIL) 5 MG tablet TAKE 1 TABLET DAILY 90 tablet 3   metFORMIN (GLUCOPHAGE) 500 MG tablet TAKE 4 TABLETS DAILY 360 tablet 3   rosuvastatin (CRESTOR) 20 MG tablet TAKE 1 TABLET DAILY 90 tablet 3   No facility-administered medications prior to visit.     EXAM:  BP 124/80 (BP Location: Left Arm, Patient Position: Sitting, Cuff Size: Normal)    Pulse 83    Temp 98.5 F (36.9 C) (Oral)    Ht 5\' 11"  (1.803 m)    Wt 208 lb 6.4 oz (94.5 kg)    SpO2 98%    BMI 29.07 kg/m   Body mass index is 29.07 kg/m.  GENERAL: vitals reviewed and listed above, alert, oriented, appears well hydrated and in no acute distress HEENT: atraumatic, conjunctiva  clear, no obvious abnormalities on inspection of external nose and ears OP : masked  NECK: no obvious masses on inspection palpation  LMS: moves all extremities without noticeable focal  abnormality PSYCH: pleasant and cooperative, no  obvious depression or anxiety Lab Results  Component Value Date   WBC 5.4 08/16/2021   HGB 14.4 08/16/2021   HCT 42.5 08/16/2021   PLT 198.0 08/16/2021   GLUCOSE 127 (H) 08/16/2021   CHOL 179 08/16/2021   TRIG 110.0 08/16/2021   HDL 59.10 08/16/2021   LDLCALC 97 08/16/2021   ALT 24 08/16/2021   AST 21 08/16/2021   NA 140 08/16/2021   K 4.3 08/16/2021   CL 104 08/16/2021   CREATININE 1.00 08/16/2021   BUN 16 08/16/2021   CO2 28 08/16/2021   TSH 1.78 07/14/2020   PSA 1.68 08/16/2021   HGBA1C 8.8 (A) 01/14/2022   MICROALBUR 0.9 08/16/2021   BP Readings from Last 3 Encounters:  01/14/22 124/80  09/11/21 136/70  03/12/21 130/80   Wt Readings from Last 3 Encounters:  01/14/22 208 lb 6.4 oz (94.5 kg)  09/11/21 202 lb 3.2 oz (91.7 kg)  03/12/21 200 lb 12.8 oz (91.1 kg)  Record review   ASSESSMENT AND PLAN:  Discussed the following assessment and plan:  Diabetes mellitus with coincident hypertension ([place]) - Significant  jump in A1c,since considering adding med otherwise we will not confirm with venipuncture today.  Add SGLT2 discussed blood sugar monitoring methods - Plan: POC HgB A1c  Essential hypertension  Medication management  History of COVID-19 Surprising jump in a1c ( POCT)  Add  farxiga 5 mg sample 14 and rx  .   Bg monitoring machine cbg or  patch .  Cautioned avoid if dehydrated .  Getting back in to exercise may help. FU planned counsel  -Patient advised to return or notify health care team  if  new concerns arise.  Patient Instructions  Continue metformin  add farxiga 5 mg ( starter dose ) and will send in  rx to your pharmacy  Check on line for  copay reduction  in cost  coupons.    Plan ROV in 3 months for fu a1c and how doing Consider getting CBG monitoring .  Machine or other .      Standley Brooking. Kimberleigh Mehan M.D.

## 2022-01-14 NOTE — Patient Instructions (Signed)
Continue metformin  add farxiga 5 mg ( starter dose ) and will send in  rx to your pharmacy  Check on line for  copay reduction  in cost  coupons.    Plan ROV in 3 months for fu a1c and how doing Consider getting CBG monitoring .  Machine or other .

## 2022-01-15 DIAGNOSIS — M5410 Radiculopathy, site unspecified: Secondary | ICD-10-CM | POA: Diagnosis not present

## 2022-01-15 DIAGNOSIS — M545 Low back pain, unspecified: Secondary | ICD-10-CM | POA: Diagnosis not present

## 2022-01-18 DIAGNOSIS — M5410 Radiculopathy, site unspecified: Secondary | ICD-10-CM | POA: Diagnosis not present

## 2022-01-18 DIAGNOSIS — M545 Low back pain, unspecified: Secondary | ICD-10-CM | POA: Diagnosis not present

## 2022-01-21 DIAGNOSIS — M545 Low back pain, unspecified: Secondary | ICD-10-CM | POA: Diagnosis not present

## 2022-01-21 DIAGNOSIS — M79605 Pain in left leg: Secondary | ICD-10-CM | POA: Diagnosis not present

## 2022-01-22 DIAGNOSIS — M545 Low back pain, unspecified: Secondary | ICD-10-CM | POA: Diagnosis not present

## 2022-01-22 DIAGNOSIS — M5410 Radiculopathy, site unspecified: Secondary | ICD-10-CM | POA: Diagnosis not present

## 2022-01-29 DIAGNOSIS — M5410 Radiculopathy, site unspecified: Secondary | ICD-10-CM | POA: Diagnosis not present

## 2022-01-29 DIAGNOSIS — M545 Low back pain, unspecified: Secondary | ICD-10-CM | POA: Diagnosis not present

## 2022-01-31 DIAGNOSIS — M545 Low back pain, unspecified: Secondary | ICD-10-CM | POA: Diagnosis not present

## 2022-02-06 DIAGNOSIS — M5410 Radiculopathy, site unspecified: Secondary | ICD-10-CM | POA: Diagnosis not present

## 2022-02-06 DIAGNOSIS — M545 Low back pain, unspecified: Secondary | ICD-10-CM | POA: Diagnosis not present

## 2022-02-07 DIAGNOSIS — M5126 Other intervertebral disc displacement, lumbar region: Secondary | ICD-10-CM | POA: Diagnosis not present

## 2022-02-13 DIAGNOSIS — M545 Low back pain, unspecified: Secondary | ICD-10-CM | POA: Diagnosis not present

## 2022-02-13 DIAGNOSIS — M5410 Radiculopathy, site unspecified: Secondary | ICD-10-CM | POA: Diagnosis not present

## 2022-02-20 DIAGNOSIS — M5410 Radiculopathy, site unspecified: Secondary | ICD-10-CM | POA: Diagnosis not present

## 2022-02-20 DIAGNOSIS — M545 Low back pain, unspecified: Secondary | ICD-10-CM | POA: Diagnosis not present

## 2022-02-27 DIAGNOSIS — M5410 Radiculopathy, site unspecified: Secondary | ICD-10-CM | POA: Diagnosis not present

## 2022-02-27 DIAGNOSIS — M545 Low back pain, unspecified: Secondary | ICD-10-CM | POA: Diagnosis not present

## 2022-04-14 NOTE — Progress Notes (Signed)
? ?Chief Complaint  ?Patient presents with  ? Follow-up  ? ? ?HPI: ?David Copeland 61 y.o. come in for Chronic disease management  ? ?DM : on metformin and added farxiga 5 mg  since last visit he has tolerated medicine without difficulty but is noticed his fasting blood sugar still remain elevated 115 occasionally 180 180   then  still high in am .  During the day blood sugar 120 range. ?He has been again to exercise again.  Has lost some weight. ?Continues attention to healthy healthy lifestyle. ? ?ROS: See pertinent positives and negatives per HPI. ? ?Past Medical History:  ?Diagnosis Date  ? History of diverticulitis of colon 8/05  ? Hyperglycemia   ? Hyperlipidemia   ? Hypertension   ? off med after lifestyle intervention contolled  ? ? ?Family History  ?Problem Relation Age of Onset  ? Stroke Father   ?     in hot weather  ? Breast cancer Mother   ? Stroke Other   ? Arthritis Neg Hx   ? ? ?Social History  ? ?Socioeconomic History  ? Marital status: Married  ?  Spouse name: Not on file  ? Number of children: Not on file  ? Years of education: Not on file  ? Highest education level: Not on file  ?Occupational History  ? Not on file  ?Tobacco Use  ? Smoking status: Never  ? Smokeless tobacco: Never  ?Vaping Use  ? Vaping Use: Never used  ?Substance and Sexual Activity  ? Alcohol use: Yes  ?  Comment: occasional  ? Drug use: No  ? Sexual activity: Not on file  ?Other Topics Concern  ? Not on file  ?Social History Narrative  ? Married with children   hhof 5   2 dogs   ? Former smoker  ? Regular exercise-yes  does marathon   ? Is [place]  ? No est. Runs  ? Sleep adequate  ? 40- 50 jours per week.  ?   ?   ?   ?   ?   ? ?Social Determinants of Health  ? ?Financial Resource Strain: Not on file  ?Food Insecurity: Not on file  ?Transportation Needs: Not on file  ?Physical Activity: Not on file  ?Stress: Not on file  ?Social Connections: Not on file  ? ? ?Outpatient Medications Prior to Visit  ?Medication  Sig Dispense Refill  ? aspirin 81 MG tablet Take 81 mg by mouth daily.    ? lisinopril (ZESTRIL) 5 MG tablet TAKE 1 TABLET DAILY 90 tablet 3  ? metFORMIN (GLUCOPHAGE) 500 MG tablet TAKE 4 TABLETS DAILY 360 tablet 3  ? rosuvastatin (CRESTOR) 20 MG tablet TAKE 1 TABLET DAILY 90 tablet 3  ? dapagliflozin propanediol (FARXIGA) 5 MG TABS tablet Take 1 tablet (5 mg total) by mouth daily. 30 tablet 3  ? ?No facility-administered medications prior to visit.  ? ? ? ?EXAM: ? ?BP 130/84 (BP Location: Left Arm, Patient Position: Sitting, Cuff Size: Normal)   Pulse 62   Temp 98.9 ?F (37.2 ?C) (Oral)   Ht '5\' 11"'$  (1.803 m)   Wt 200 lb 12.8 oz (91.1 kg)   SpO2 99%   BMI 28.01 kg/m?  ? ?Body mass index is 28.01 kg/m?. ?Wt Readings from Last 3 Encounters:  ?04/15/22 200 lb 12.8 oz (91.1 kg)  ?01/14/22 208 lb 6.4 oz (94.5 kg)  ?09/11/21 202 lb 3.2 oz (91.7 kg)  ? ? ?GENERAL: vitals  reviewed and listed above, alert, oriented, appears well hydrated and in no acute distress ?HEENT: atraumatic, conjunctiva  clear, no obvious abnormalities on inspection of external nose and ears MS: moves all extremities without noticeable focal  abnormality ?PSYCH: pleasant and cooperative, no obvious depression or anxiety ?Lab Results  ?Component Value Date  ? WBC 5.4 08/16/2021  ? HGB 14.4 08/16/2021  ? HCT 42.5 08/16/2021  ? PLT 198.0 08/16/2021  ? GLUCOSE 127 (H) 08/16/2021  ? CHOL 179 08/16/2021  ? TRIG 110.0 08/16/2021  ? HDL 59.10 08/16/2021  ? [place] 97 08/16/2021  ? ALT 24 08/16/2021  ? AST 21 08/16/2021  ? NA 140 08/16/2021  ? K 4.3 08/16/2021  ? CL 104 08/16/2021  ? CREATININE 1.00 08/16/2021  ? BUN 16 08/16/2021  ? CO2 28 08/16/2021  ? TSH 1.78 07/14/2020  ? PSA 1.68 08/16/2021  ? HGBA1C 7.1 (A) 04/15/2022  ? MICROALBUR 0.9 08/16/2021  ? ?BP Readings from Last 3 Encounters:  ?04/15/22 130/84  ?01/14/22 124/80  ?09/11/21 136/70  ? ?7.1 hemoglobin A1c ?ASSESSMENT AND PLAN: ? ?Discussed the following assessment and plan: ? ?Diabetes  mellitus with coincident hypertension ([facility]) - A1c is dropped from 8.8-7.1 point-of-care on Farxiga 5 mg and intensification of lifestyle with persistent elevated fasting BG - Plan: POC HgB A1c ? ?Essential hypertension ? ?Medication management ?Out for a few days of medication. ?Discussed options increasing dose to 10 mg continue discussed options increasing dose to 10 mg continue same and lifestyle intervention as significant improvement has occurred. ?Refill Farxiga 5 mg to mail away ?Plan follow-up visit in 3 to 4 months A1c at visit. ?Cautions of stopping medication if dehydrated or illness related. ? ?-Patient advised to return or notify health care team  if  new concerns arise. ? ?Patient Instructions  ?Good to  see you today  ?A1c now 7.1  ? ?Refill farxiga and continue ?Let us know if  concerning  sugar readings  we can increase dose to 10 mg  ? ?Standley Brooking. Magic Mohler M.D. ?

## 2022-04-15 ENCOUNTER — Encounter: Payer: Self-pay | Admitting: Internal Medicine

## 2022-04-15 ENCOUNTER — Ambulatory Visit: Payer: BC Managed Care – PPO | Admitting: Internal Medicine

## 2022-04-15 VITALS — BP 130/84 | HR 62 | Temp 98.9°F | Ht 71.0 in | Wt 200.8 lb

## 2022-04-15 DIAGNOSIS — E119 Type 2 diabetes mellitus without complications: Secondary | ICD-10-CM

## 2022-04-15 DIAGNOSIS — I10 Essential (primary) hypertension: Secondary | ICD-10-CM | POA: Diagnosis not present

## 2022-04-15 DIAGNOSIS — Z79899 Other long term (current) drug therapy: Secondary | ICD-10-CM | POA: Diagnosis not present

## 2022-04-15 LAB — POCT GLYCOSYLATED HEMOGLOBIN (HGB A1C): Hemoglobin A1C: 7.1 % — AB (ref 4.0–5.6)

## 2022-04-15 MED ORDER — DAPAGLIFLOZIN PROPANEDIOL 5 MG PO TABS: 5.0000 mg | ORAL_TABLET | Freq: Every day | ORAL | 2 refills | Status: DC

## 2022-04-15 NOTE — Patient Instructions (Signed)
Good to  see you today  ?A1c now 7.1  ? ?Refill farxiga and continue ?Let us know if  concerning  sugar readings  we can increase dose to 10 mg  ?

## 2022-08-18 NOTE — Progress Notes (Unsigned)
No chief complaint on file.   HPI: David Copeland 60 y.o. come in for Chronic disease management  Farxiga inc to 10 mg    last a1c I 7.1 in April  ROS: See pertinent positives and negatives per HPI.  Past Medical History:  Diagnosis Date   History of diverticulitis of colon 8/05   Hyperglycemia    Hyperlipidemia    Hypertension    off med after lifestyle intervention contolled    Family History  Problem Relation Age of Onset   Stroke Father        in hot weather   Breast cancer Mother    Stroke Other    Arthritis Neg Hx     Social History   Socioeconomic History   Marital status: Married    Spouse name: Not on file   Number of children: Not on file   Years of education: Not on file   Highest education level: Not on file  Occupational History   Not on file  Tobacco Use   Smoking status: Never   Smokeless tobacco: Never  Vaping Use   Vaping Use: Never used  Substance and Sexual Activity   Alcohol use: Yes    Comment: occasional   Drug use: No   Sexual activity: Not on file  Other Topics Concern   Not on file  Social History Narrative   Married with children   hhof 5   2 dogs    Former smoker   Regular exercise-yes  does marathon    [place]   No est. Runs   Sleep adequate   40- 50 jours per week.                  Social Determinants of Health   Financial Resource Strain: Not on file  Food Insecurity: Not on file  Transportation Needs: Not on file  Physical Activity: Not on file  Stress: Not on file  Social Connections: Not on file    Outpatient Medications Prior to Visit  Medication Sig Dispense Refill   aspirin 81 MG tablet Take 81 mg by mouth daily.     dapagliflozin propanediol (FARXIGA) 5 MG TABS tablet Take 1 tablet (5 mg total) by mouth daily. 90 tablet 2   lisinopril (ZESTRIL) 5 MG tablet TAKE 1 TABLET DAILY 90 tablet 3   metFORMIN (GLUCOPHAGE) 500 MG tablet TAKE 4 TABLETS DAILY 360 tablet 3   rosuvastatin  (CRESTOR) 20 MG tablet TAKE 1 TABLET DAILY 90 tablet 3   No facility-administered medications prior to visit.     EXAM:  There were no vitals taken for this visit.  There is no height or weight on file to calculate BMI.  GENERAL: vitals reviewed and listed above, alert, oriented, appears well hydrated and in no acute distress HEENT: atraumatic, conjunctiva  clear, no obvious abnormalities on inspection of external nose and ears OP : no lesion edema or exudate  NECK: no obvious masses on inspection palpation  LUNGS: clear to auscultation bilaterally, no wheezes, rales or rhonchi, good air movement CV: HRRR, no clubbing cyanosis or  peripheral edema nl cap refill  MS: moves all extremities without noticeable focal  abnormality PSYCH: pleasant and cooperative, no obvious depression or anxiety Lab Results  Component Value Date   WBC 5.4 08/16/2021   HGB 14.4 08/16/2021   HCT 42.5 08/16/2021   PLT 198.0 08/16/2021   GLUCOSE 127 (H) 08/16/2021   CHOL 179 08/16/2021   TRIG  110.0 08/16/2021   HDL 59.10 08/16/2021   LDLCALC 97 08/16/2021   ALT 24 08/16/2021   AST 21 08/16/2021   NA 140 08/16/2021   K 4.3 08/16/2021   CL 104 08/16/2021   CREATININE 1.00 08/16/2021   BUN 16 08/16/2021   CO2 28 08/16/2021   TSH 1.78 07/14/2020   PSA 1.68 08/16/2021   HGBA1C 7.1 (A) 04/15/2022   MICROALBUR 0.9 08/16/2021   BP Readings from Last 3 Encounters:  04/15/22 130/84  01/14/22 124/80  09/11/21 136/70    ASSESSMENT AND PLAN:  Discussed the following assessment and plan:  Diabetes mellitus with coincident hypertension ([place])  Medication management  Essential hypertension Due for panel of blood monitoring  xoz seotember  -Patient advised to return or notify health care team  if  new concerns arise.  There are no Patient Instructions on file for this visit.   Standley Brooking. Loni Abdon M.D.

## 2022-08-19 ENCOUNTER — Ambulatory Visit: Payer: BC Managed Care – PPO | Admitting: Internal Medicine

## 2022-08-19 ENCOUNTER — Encounter: Payer: Self-pay | Admitting: Internal Medicine

## 2022-08-19 VITALS — BP 150/90 | HR 54 | Temp 97.9°F | Wt 192.6 lb

## 2022-08-19 DIAGNOSIS — Z79899 Other long term (current) drug therapy: Secondary | ICD-10-CM | POA: Diagnosis not present

## 2022-08-19 DIAGNOSIS — I10 Essential (primary) hypertension: Secondary | ICD-10-CM

## 2022-08-19 DIAGNOSIS — E119 Type 2 diabetes mellitus without complications: Secondary | ICD-10-CM

## 2022-08-19 DIAGNOSIS — E785 Hyperlipidemia, unspecified: Secondary | ICD-10-CM | POA: Diagnosis not present

## 2022-08-19 DIAGNOSIS — Z125 Encounter for screening for malignant neoplasm of prostate: Secondary | ICD-10-CM | POA: Diagnosis not present

## 2022-08-19 DIAGNOSIS — Z1211 Encounter for screening for malignant neoplasm of colon: Secondary | ICD-10-CM

## 2022-08-19 LAB — MICROALBUMIN / CREATININE URINE RATIO
Creatinine,U: 120.3 mg/dL
Microalb Creat Ratio: 0.7 mg/g (ref 0.0–30.0)
Microalb, Ur: 0.9 mg/dL (ref 0.0–1.9)

## 2022-08-19 LAB — CBC WITH DIFFERENTIAL/PLATELET
Basophils Absolute: 0 10*3/uL (ref 0.0–0.1)
Basophils Relative: 0.5 % (ref 0.0–3.0)
Eosinophils Absolute: 0.1 10*3/uL (ref 0.0–0.7)
Eosinophils Relative: 0.8 % (ref 0.0–5.0)
HCT: 47 % (ref 39.0–52.0)
Hemoglobin: 15.7 g/dL (ref 13.0–17.0)
Lymphocytes Relative: 22.2 % (ref 12.0–46.0)
Lymphs Abs: 1.4 10*3/uL (ref 0.7–4.0)
MCHC: 33.4 g/dL (ref 30.0–36.0)
MCV: 89.5 fl (ref 78.0–100.0)
Monocytes Absolute: 0.4 10*3/uL (ref 0.1–1.0)
Monocytes Relative: 6 % (ref 3.0–12.0)
Neutro Abs: 4.3 10*3/uL (ref 1.4–7.7)
Neutrophils Relative %: 70.5 % (ref 43.0–77.0)
Platelets: 202 10*3/uL (ref 150.0–400.0)
RBC: 5.26 Mil/uL (ref 4.22–5.81)
RDW: 13.7 % (ref 11.5–15.5)
WBC: 6.1 10*3/uL (ref 4.0–10.5)

## 2022-08-19 LAB — LIPID PANEL
Cholesterol: 170 mg/dL (ref 0–200)
HDL: 63.1 mg/dL (ref >39.00)
LDL Cholesterol: 94 mg/dL (ref 0–99)
NonHDL: 107.08
Total CHOL/HDL Ratio: 3
Triglycerides: 67 mg/dL (ref 0.0–149.0)
VLDL: 13.4 mg/dL (ref 0.0–40.0)

## 2022-08-19 LAB — BASIC METABOLIC PANEL
BUN: 16 mg/dL (ref 6–23)
CO2: 29 mEq/L (ref 19–32)
Calcium: 9.7 mg/dL (ref 8.4–10.5)
Chloride: 104 mEq/L (ref 96–112)
Creatinine, Ser: 0.98 mg/dL (ref 0.40–1.50)
GFR: 84.81 mL/min (ref >60.00)
Glucose, Bld: 139 mg/dL — ABNORMAL HIGH (ref 70–99)
Potassium: 4.9 mEq/L (ref 3.5–5.1)
Sodium: 141 mEq/L (ref 135–145)

## 2022-08-19 LAB — HEPATIC FUNCTION PANEL
ALT: 21 U/L (ref 0–53)
AST: 24 U/L (ref 0–37)
Albumin: 4.4 g/dL (ref 3.5–5.2)
Alkaline Phosphatase: 58 U/L (ref 39–117)
Bilirubin, Direct: 0.1 mg/dL (ref 0.0–0.3)
Total Bilirubin: 0.7 mg/dL (ref 0.2–1.2)
Total Protein: 7 g/dL (ref 6.0–8.3)

## 2022-08-19 LAB — POCT GLYCOSYLATED HEMOGLOBIN (HGB A1C): Hemoglobin A1C: 6.7 % — AB (ref 4.0–5.6)

## 2022-08-19 LAB — PSA: PSA: 1.56 ng/mL (ref 0.10–4.00)

## 2022-08-19 NOTE — Patient Instructions (Signed)
Good to see  you today   a1c is improved. Continue lifestyle intervention healthy eating and exercise .  Take blood pressure readings twice a day for 5-7 days and then periodically .T   .Send in readings    Goal 130/80 ad below average .   Fasting lab today.  You will  be contacted about  colonoscopy   screening .   Plan cpx in fall depending

## 2022-08-19 NOTE — Progress Notes (Signed)
Lab results normal  range except BG .

## 2022-08-29 ENCOUNTER — Ambulatory Visit (AMBULATORY_SURGERY_CENTER): Payer: BC Managed Care – PPO | Admitting: *Deleted

## 2022-08-29 VITALS — Ht 71.0 in | Wt 195.0 lb

## 2022-08-29 DIAGNOSIS — Z1211 Encounter for screening for malignant neoplasm of colon: Secondary | ICD-10-CM

## 2022-08-29 MED ORDER — NA SULFATE-K SULFATE-MG SULF 17.5-3.13-1.6 GM/177ML PO SOLN: 1.0000 | ORAL | 0 refills | Status: DC

## 2022-08-29 NOTE — Progress Notes (Signed)
Patient is here in-person for PV. Patient denies any allergies to eggs or soy. Patient denies any problems with anesthesia/sedation. Patient is not on any oxygen at home. Patient is not taking any diet/weight loss medications or blood thinners. Went over procedure prep instructions with the patient. Patient is aware of our care-partner policy. Patient notified to use [employer] card given to pt for prescription.

## 2022-09-15 ENCOUNTER — Other Ambulatory Visit: Payer: Self-pay | Admitting: Internal Medicine

## 2022-09-16 ENCOUNTER — Encounter: Payer: Self-pay | Admitting: Gastroenterology

## 2022-10-01 ENCOUNTER — Ambulatory Visit (AMBULATORY_SURGERY_CENTER): Payer: BC Managed Care – PPO | Admitting: Gastroenterology

## 2022-10-01 ENCOUNTER — Encounter: Payer: Self-pay | Admitting: Gastroenterology

## 2022-10-01 VITALS — BP 123/78 | HR 56 | Temp 96.6°F | Resp 19 | Ht 71.0 in | Wt 195.0 lb

## 2022-10-01 DIAGNOSIS — D122 Benign neoplasm of ascending colon: Secondary | ICD-10-CM

## 2022-10-01 DIAGNOSIS — K5140 Inflammatory polyps of colon without complications: Secondary | ICD-10-CM | POA: Diagnosis not present

## 2022-10-01 DIAGNOSIS — K640 First degree hemorrhoids: Secondary | ICD-10-CM

## 2022-10-01 DIAGNOSIS — K5730 Diverticulosis of large intestine without perforation or abscess without bleeding: Secondary | ICD-10-CM

## 2022-10-01 DIAGNOSIS — K635 Polyp of colon: Secondary | ICD-10-CM | POA: Diagnosis not present

## 2022-10-01 DIAGNOSIS — K621 Rectal polyp: Secondary | ICD-10-CM | POA: Diagnosis not present

## 2022-10-01 DIAGNOSIS — Z1211 Encounter for screening for malignant neoplasm of colon: Secondary | ICD-10-CM | POA: Diagnosis not present

## 2022-10-01 DIAGNOSIS — D128 Benign neoplasm of rectum: Secondary | ICD-10-CM

## 2022-10-01 DIAGNOSIS — D124 Benign neoplasm of descending colon: Secondary | ICD-10-CM

## 2022-10-01 MED ORDER — SODIUM CHLORIDE 0.9 % IV SOLN: 500.0000 mL | Freq: Once | INTRAVENOUS | Status: DC

## 2022-10-01 NOTE — Op Note (Signed)
[facility] Patient Name: David Copeland Procedure Date: 10/01/2022 9:27 AM MRN: [id number] Endoscopist: Gerrit Heck , MD Age: 61 Referring MD:  Date of Birth: [birth year] Gender: Male Account #: [id number] Procedure:                Colonoscopy Indications:              Screening for colorectal malignant neoplasm (last                            colonoscopy was more than 10 years ago)                           Colonoscopy in 07/2004 with sigmoid diverticulosis,                            internal hemorrhoids, and 3 benign small rectal                            hyperplastic polyps. Medicines:                Monitored Anesthesia Care Procedure:                Pre-Anesthesia Assessment:                           - Prior to the procedure, a History and Physical                            was performed, and patient medications and                            allergies were reviewed. The patient's tolerance of                            previous anesthesia was also reviewed. The risks                            and benefits of the procedure and the sedation                            options and risks were discussed with the patient.                            All questions were answered, and informed consent                            was obtained. Prior Anticoagulants: The patient has                            taken no previous anticoagulant or antiplatelet                            agents. ASA Grade Assessment: II - A patient with  mild systemic disease. After reviewing the risks                            and benefits, the patient was deemed in                            satisfactory condition to undergo the procedure.                           After obtaining informed consent, the colonoscope                            was passed under direct vision. Throughout the                            procedure, the patient's blood pressure, pulse,  and                            oxygen saturations were monitored continuously. The                            Olympus CF-HQ190L (Serial# [id number]) Colonoscope was                            introduced through the anus and advanced to the the                            terminal ileum. The colonoscopy was performed                            without difficulty. The patient tolerated the                            procedure well. The quality of the bowel                            preparation was good. The terminal ileum, ileocecal                            valve, appendiceal orifice, and rectum were                            photographed. Scope In: 9:30:46 AM Scope Out: 9:55:04 AM Scope Withdrawal Time: 0 hours 21 minutes 4 seconds  Total Procedure Duration: 0 hours 24 minutes 18 seconds  Findings:                 The perianal and digital rectal examinations were                            normal.                           Two sessile polyps were found in the ascending  colon. The polyps were 6 to 8 mm in size. These                            polyps were removed with a cold snare. Resection                            and retrieval were complete. Estimated blood loss                            was minimal.                           A 5 mm polyp was found in the descending colon. The                            polyp was sessile. The polyp was removed with a                            cold snare. Resection and retrieval were complete.                            Estimated blood loss was minimal.                           A 3 mm polyp was found in the rectum. The polyp was                            sessile. The polyp was removed with a cold snare.                            Resection and retrieval were complete. Estimated                            blood loss was minimal.                           Multiple small and large-mouthed diverticula were                             found in the sigmoid colon.                           Non-bleeding internal hemorrhoids were found during                            retroflexion. The hemorrhoids were small.                           The terminal ileum appeared normal. Complications:            No immediate complications. Estimated Blood Loss:     Estimated blood loss was minimal. Impression:               - Two 6 to 8 mm polyps in the ascending colon,  removed with a cold snare. Resected and retrieved.                           - One 5 mm polyp in the descending colon, removed                            with a cold snare. Resected and retrieved.                           - One 3 mm polyp in the rectum, removed with a cold                            snare. Resected and retrieved.                           - Diverticulosis in the sigmoid colon.                           - Non-bleeding internal hemorrhoids.                           - The examined portion of the ileum was normal. Recommendation:           - Patient has a contact number available for                            emergencies. The signs and symptoms of potential                            delayed complications were discussed with the                            patient. Return to normal activities tomorrow.                            Written discharge instructions were provided to the                            patient.                           - Resume previous diet.                           - Continue present medications.                           - Await pathology results.                           - Repeat colonoscopy for surveillance based on                            pathology results.                           -  Return to GI office PRN. Gerrit Heck, MD 10/01/2022 10:04:03 AM

## 2022-10-01 NOTE — Progress Notes (Signed)
To pacu, VSS. Report to [email or web address] 

## 2022-10-01 NOTE — Progress Notes (Signed)
Pt's states no medical or surgical changes since previsit or office visit. 

## 2022-10-01 NOTE — Progress Notes (Signed)
GASTROENTEROLOGY PROCEDURE H&P NOTE   Primary Care Physician: Burnis Medin, MD    Reason for Procedure:  Colon Cancer screening  Plan:    Colonoscopy  Patient is appropriate for endoscopic procedure(s) in the ambulatory ([place]) setting.  The nature of the procedure, as well as the risks, benefits, and alternatives were carefully and thoroughly reviewed with the patient. Ample time for discussion and questions allowed. The patient understood, was satisfied, and agreed to proceed.     HPI: David Copeland is a 61 y.o. male who presents for colonoscopy for routine Colon Cancer screening.  No active GI symptoms.  No known family history of colon cancer or related malignancy.    Colonoscopy in 07/2004 with sigmoid diverticulosis, internal hemorrhoids, and 3 benign small rectal hyperplastic polyps. Patient is otherwise without complaints or active issues today.  Past Medical History:  Diagnosis Date   Diabetes mellitus without complication ([place])    pre-DM   History of diverticulitis of colon 07/30/2004   Hyperglycemia    Hyperlipidemia    Hypertension    off med after lifestyle intervention contolled    Past Surgical History:  Procedure Laterality Date   ADENOIDECTOMY     COLONOSCOPY  2005   Dr.[facility]   removed breast glands     damaged secondary to soccer   TONSILLECTOMY     WISDOM TOOTH EXTRACTION      Prior to Admission medications   Medication Sig Start Date End Date Taking? Authorizing Provider  aspirin 81 MG tablet Take 81 mg by mouth daily.   Yes [provider]  dapagliflozin propanediol (FARXIGA) 5 MG TABS tablet Take 1 tablet (5 mg total) by mouth daily. 04/15/22  Yes Panosh, Standley Brooking, MD  lisinopril (ZESTRIL) 5 MG tablet TAKE 1 TABLET DAILY 09/17/22  Yes Panosh, Standley Brooking, MD  metFORMIN (GLUCOPHAGE) 500 MG tablet TAKE 4 TABLETS DAILY 09/17/22  Yes Panosh, Standley Brooking, MD  rosuvastatin (CRESTOR) 20 MG tablet TAKE 1 TABLET DAILY 09/17/22  Yes Panosh,  Standley Brooking, MD    Current Outpatient Medications  Medication Sig Dispense Refill   aspirin 81 MG tablet Take 81 mg by mouth daily.     dapagliflozin propanediol (FARXIGA) 5 MG TABS tablet Take 1 tablet (5 mg total) by mouth daily. 90 tablet 2   lisinopril (ZESTRIL) 5 MG tablet TAKE 1 TABLET DAILY 90 tablet 3   metFORMIN (GLUCOPHAGE) 500 MG tablet TAKE 4 TABLETS DAILY 360 tablet 3   rosuvastatin (CRESTOR) 20 MG tablet TAKE 1 TABLET DAILY 90 tablet 3   Current Facility-Administered Medications  Medication Dose Route Frequency Provider Last Rate Last Admin   0.9 %  sodium chloride infusion  500 mL Intravenous Once Mumin Denomme V, DO        Allergies as of 10/01/2022 - Review Complete 10/01/2022  Allergen Reaction Noted   Atorvastatin Other (See Comments) 07/14/2008    Family History  Problem Relation Age of Onset   Breast cancer Mother    Stroke Father        in hot weather   Stroke Other    Arthritis Neg Hx    Colon cancer Neg Hx    Colon polyps Neg Hx    Esophageal cancer Neg Hx    Stomach cancer Neg Hx    Rectal cancer Neg Hx     Social History   Socioeconomic History   Marital status: Married    Spouse name: Not on file   Number of  children: Not on file   Years of education: Not on file   Highest education level: Not on file  Occupational History   Not on file  Tobacco Use   Smoking status: Never   Smokeless tobacco: Never  Vaping Use   Vaping Use: Never used  Substance and Sexual Activity   Alcohol use: Yes    Alcohol/week: 5.0 standard drinks of alcohol    Types: 5 Standard drinks or equivalent per week   Drug use: No   Sexual activity: Not on file  Other Topics Concern   Not on file  Social History Narrative   Married with children   hhof 5   2 dogs    Former smoker   Regular exercise-yes  does marathon    Is [place] as [place]   No est. Runs   Sleep adequate   40- 50 jours per week.                  Social Determinants of Health    Financial Resource Strain: Not on file  Food Insecurity: Not on file  Transportation Needs: Not on file  Physical Activity: Not on file  Stress: Not on file  Social Connections: Not on file  Intimate Partner Violence: Not on file    Physical Exam: Vital signs in last 24 hours: '@BP'$  130/81   Pulse 63   Temp (!) 96.6 F (35.9 C)   Ht '5\' 11"'$  (1.803 m)   Wt 195 lb (88.5 kg)   SpO2 100%   BMI 27.20 kg/m  GEN: NAD EYE: Sclerae anicteric ENT: MMM CV: Non-tachycardic Pulm: CTA b/l GI: Soft, NT/ND NEURO:  Alert & Oriented x 3   Gerrit Heck, DO [facility]   10/01/2022 9:24 AM

## 2022-10-01 NOTE — Patient Instructions (Signed)
   Handouts on polyps,diverticulosis,& hemorrhoids given to you   Await pathology results on polyps removed    YOU HAD AN ENDOSCOPIC PROCEDURE TODAY AT [facility]:   Refer to the procedure report that was given to you for any specific questions about what was found during the examination.  If the procedure report does not answer your questions, please call your gastroenterologist to clarify.  If you requested that your care partner not be given the details of your procedure findings, then the procedure report has been included in a sealed envelope for you to review at your convenience later.  YOU SHOULD EXPECT: Some feelings of bloating in the abdomen. Passage of more gas than usual.  Walking can help get rid of the air that was put into your GI tract during the procedure and reduce the bloating. If you had a lower endoscopy (such as a colonoscopy or flexible sigmoidoscopy) you may notice spotting of blood in your stool or on the toilet paper. If you underwent a bowel prep for your procedure, you may not have a normal bowel movement for a few days.  Please Note:  You might notice some irritation and congestion in your nose or some drainage.  This is from the oxygen used during your procedure.  There is no need for concern and it should clear up in a day or so.  SYMPTOMS TO REPORT IMMEDIATELY:  Following lower endoscopy (colonoscopy or flexible sigmoidoscopy):  Excessive amounts of blood in the stool  Significant tenderness or worsening of abdominal pains  Swelling of the abdomen that is new, acute  Fever of 100F or higher   For urgent or emergent issues, a gastroenterologist can be reached at any hour by calling [phone]. Do not use MyChart messaging for urgent concerns.    DIET:  We do recommend a small meal at first, but then you may proceed to your regular diet.  Drink plenty of fluids but you should avoid alcoholic beverages for 24 hours.  ACTIVITY:  You should  plan to take it easy for the rest of today and you should NOT DRIVE or use heavy machinery until tomorrow (because of the sedation medicines used during the test).    FOLLOW UP: Our staff will call the number listed on your records the next business day following your procedure.  We will call around 7:15- 8:00 am to check on you and address any questions or concerns that you may have regarding the information given to you following your procedure. If we do not reach you, we will leave a message.     If any biopsies were taken you will be contacted by phone or by letter within the next 1-3 weeks.  Please call us at [phone] if you have not heard about the biopsies in 3 weeks.    SIGNATURES/CONFIDENTIALITY: You and/or your care partner have signed paperwork which will be entered into your electronic medical record.  These signatures attest to the fact that that the information above on your After Visit Summary has been reviewed and is understood.  Full responsibility of the confidentiality of this discharge information lies with you and/or your care-partner.

## 2022-10-02 ENCOUNTER — Telehealth: Payer: Self-pay

## 2022-10-02 NOTE — Telephone Encounter (Signed)
  Follow up Call-     10/01/2022    8:59 AM  Call back number  Post procedure Call Back phone  # [phone]  Permission to leave phone message Yes     Patient questions:  Do you have a fever, pain , or abdominal swelling? No. Pain Score  0 *  Have you tolerated food without any problems? Yes.    Have you been able to return to your normal activities? Yes.    Do you have any questions about your discharge instructions: Diet   No. Medications  No. Follow up visit  No.  Do you have questions or concerns about your Care? No.  Actions: * If pain score is 4 or above: No action needed, pain <4.

## 2022-10-08 ENCOUNTER — Encounter: Payer: Self-pay | Admitting: Gastroenterology

## 2022-11-18 NOTE — Progress Notes (Unsigned)
No chief complaint on file.   HPI: Patient  David Copeland  61 y.o. comes in today for Preventive Health Care visit   Health Maintenance  Topic Date Due   OPHTHALMOLOGY EXAM  Never done   HIV Screening  Never done   Zoster Vaccines- Shingrix (1 of 2) Never done   COVID-19 Vaccine (4 - Pfizer risk series) 01/08/2021   INFLUENZA VACCINE  07/30/2022   FOOT EXAM  09/11/2022   HEMOGLOBIN A1C  02/19/2023   Diabetic kidney evaluation - GFR measurement  08/20/2023   Diabetic kidney evaluation - Urine ACR  08/20/2023   COLONOSCOPY (Pts 45-45yr Insurance coverage will need to be confirmed)  10/02/2027   Hepatitis C Screening  Completed   HPV VACCINES  Aged Out   Health Maintenance Review LIFESTYLE:  Exercise:   Tobacco/ETS: Alcohol:  Sugar beverages: Sleep: Drug use: no HH of  Work:    ROS:  GEN/ HEENT: No fever, significant weight changes sweats headaches vision problems hearing changes, CV/ PULM; No chest pain shortness of breath cough, syncope,edema  change in exercise tolerance. GI /GU: No adominal pain, vomiting, change in bowel habits. No blood in the stool. No significant GU symptoms. SKIN/HEME: ,no acute skin rashes suspicious lesions or bleeding. No lymphadenopathy, nodules, masses.  NEURO/ PSYCH:  No neurologic signs such as weakness numbness. No depression anxiety. IMM/ Allergy: No unusual infections.  Allergy .   REST of 12 system review negative except as per HPI   Past Medical History:  Diagnosis Date   Diabetes mellitus without complication ([place]    pre-DM   History of diverticulitis of colon 07/30/2004   Hyperglycemia    Hyperlipidemia    Hypertension    off med after lifestyle intervention contolled    Past Surgical History:  Procedure Laterality Date   ADENOIDECTOMY     COLONOSCOPY  2005   Dr.[facility]   removed breast glands     damaged secondary to soccer   TONSILLECTOMY     WISDOM TOOTH EXTRACTION      Family History  Problem  Relation Age of Onset   Breast cancer Mother    Stroke Father        in hot weather   Stroke Other    Arthritis Neg Hx    Colon cancer Neg Hx    Colon polyps Neg Hx    Esophageal cancer Neg Hx    Stomach cancer Neg Hx    Rectal cancer Neg Hx     Social History   Socioeconomic History   Marital status: Married    Spouse name: Not on file   Number of children: Not on file   Years of education: Not on file   Highest education level: Not on file  Occupational History   Not on file  Tobacco Use   Smoking status: Never   Smokeless tobacco: Never  Vaping Use   Vaping Use: Never used  Substance and Sexual Activity   Alcohol use: Yes    Alcohol/week: 5.0 standard drinks of alcohol    Types: 5 Standard drinks or equivalent per week   Drug use: No   Sexual activity: Not on file  Other Topics Concern   Not on file  Social History Narrative   Married with children   hhof 5   2 dogs    Former smoker   Regular exercise-yes  does marathon    [place]   No est. Runs   Sleep  adequate   40- 50 jours per week.                  Social Determinants of Health   Financial Resource Strain: Not on file  Food Insecurity: Not on file  Transportation Needs: Not on file  Physical Activity: Not on file  Stress: Not on file  Social Connections: Not on file    Outpatient Medications Prior to Visit  Medication Sig Dispense Refill   aspirin 81 MG tablet Take 81 mg by mouth daily.     dapagliflozin propanediol (FARXIGA) 5 MG TABS tablet Take 1 tablet (5 mg total) by mouth daily. 90 tablet 2   lisinopril (ZESTRIL) 5 MG tablet TAKE 1 TABLET DAILY 90 tablet 3   metFORMIN (GLUCOPHAGE) 500 MG tablet TAKE 4 TABLETS DAILY 360 tablet 3   rosuvastatin (CRESTOR) 20 MG tablet TAKE 1 TABLET DAILY 90 tablet 3   No facility-administered medications prior to visit.     EXAM:  There were no vitals taken for this visit.  There is no height or weight on file to calculate BMI. Wt  Readings from Last 3 Encounters:  10/01/22 195 lb (88.5 kg)  08/29/22 195 lb (88.5 kg)  08/19/22 192 lb 9.6 oz (87.4 kg)    Physical Exam: Vital signs reviewed [id number] is a well-developed well-nourished alert cooperative    who appearsr stated age in no acute distress.  HEENT: normocephalic atraumatic , Eyes: PERRL EOM's full, conjunctiva clear, Nares: paten,t no deformity discharge or tenderness., Ears: no deformity EAC's clear TMs with normal landmarks. Mouth: clear OP, no lesions, edema.  Moist mucous membranes. Dentition in adequate repair. NECK: supple without masses, thyromegaly or bruits. CHEST/PULM:  Clear to auscultation and percussion breath sounds equal no wheeze , rales or rhonchi. No chest wall deformities or tenderness. Breast: normal by inspection . No dimpling, discharge, masses, tenderness or discharge . CV: PMI is nondisplaced, S1 S2 no gallops, murmurs, rubs. Peripheral pulses are full without delay.No JVD .  ABDOMEN: Bowel sounds normal nontender  No guard or rebound, no hepato splenomegal no CVA tenderness.  No hernia. Extremtities:  No clubbing cyanosis or edema, no acute joint swelling or redness no focal atrophy NEURO:  Oriented x3, cranial nerves 3-12 appear to be intact, no obvious focal weakness,gait within normal limits no abnormal reflexes or asymmetrical SKIN: No acute rashes normal turgor, color, no bruising or petechiae. PSYCH: Oriented, good eye contact, no obvious depression anxiety, cognition and judgment appear normal. LN: no cervical axillary inguinal adenopathy  Lab Results  Component Value Date   WBC 6.1 08/19/2022   HGB 15.7 08/19/2022   HCT 47.0 08/19/2022   PLT 202.0 08/19/2022   GLUCOSE 139 (H) 08/19/2022   CHOL 170 08/19/2022   TRIG 67.0 08/19/2022   HDL 63.10 08/19/2022   LDLCALC 94 08/19/2022   ALT 21 08/19/2022   AST 24 08/19/2022   NA 141 08/19/2022   K 4.9 08/19/2022   CL 104 08/19/2022   CREATININE 0.98 08/19/2022   BUN 16  08/19/2022   CO2 29 08/19/2022   TSH 1.78 07/14/2020   PSA 1.56 08/19/2022   HGBA1C 6.7 (A) 08/19/2022   MICROALBUR 0.9 08/19/2022    BP Readings from Last 3 Encounters:  10/01/22 123/78  08/19/22 (!) 150/90  04/15/22 130/84    Lab results reviewed with patient   ASSESSMENT AND PLAN:  Discussed the following assessment and plan:    ICD-10-CM   1. Visit for preventive health examination  Z00.00     2. Medication management  Z79.899     3. Essential hypertension  I10     4. Diabetes mellitus with coincident hypertension ([place])  E11.9    I10     5. Hyperlipidemia, unspecified hyperlipidemia type  E78.5     Update A1c  No follow-ups on file.  Patient Care Team: David Copeland, Standley Brooking, MD as PCP - General Juanita Craver, MD as Consulting Physician (Gastroenterology) There are no Patient Instructions on file for this visit.  Standley Brooking. Harmony Sandell M.D.

## 2022-11-19 ENCOUNTER — Ambulatory Visit: Payer: BC Managed Care – PPO | Admitting: Internal Medicine

## 2022-11-19 ENCOUNTER — Encounter: Payer: Self-pay | Admitting: Internal Medicine

## 2022-11-19 VITALS — BP 140/80 | HR 61 | Temp 98.1°F | Wt 190.6 lb

## 2022-11-19 DIAGNOSIS — Z0000 Encounter for general adult medical examination without abnormal findings: Secondary | ICD-10-CM | POA: Diagnosis not present

## 2022-11-19 DIAGNOSIS — Z79899 Other long term (current) drug therapy: Secondary | ICD-10-CM

## 2022-11-19 DIAGNOSIS — E785 Hyperlipidemia, unspecified: Secondary | ICD-10-CM

## 2022-11-19 DIAGNOSIS — I10 Essential (primary) hypertension: Secondary | ICD-10-CM | POA: Diagnosis not present

## 2022-11-19 DIAGNOSIS — E119 Type 2 diabetes mellitus without complications: Secondary | ICD-10-CM

## 2022-11-19 DIAGNOSIS — Z86010 Personal history of colonic polyps: Secondary | ICD-10-CM

## 2022-11-19 LAB — POCT GLYCOSYLATED HEMOGLOBIN (HGB A1C): Hemoglobin A1C: 6.4 % — AB (ref 4.0–5.6)

## 2022-11-19 NOTE — Patient Instructions (Addendum)
Good to see you today .  A1c is  6.4   Continue lifestyle intervention healthy eating and exercise .  Eye exam due .   ROV in 4-6 months  Keep ocass bp monitoring to ensure at goal   rpeat bp was 140/80 Bp was 130s at visit for colonsocspy.

## 2023-02-12 ENCOUNTER — Other Ambulatory Visit: Payer: Self-pay | Admitting: Internal Medicine

## 2023-03-06 ENCOUNTER — Encounter: Payer: Self-pay | Admitting: Internal Medicine

## 2023-03-12 ENCOUNTER — Telehealth: Payer: Self-pay

## 2023-03-12 NOTE — Telephone Encounter (Signed)
Received a fax.  Under pt plan, Wilder Glade is covered 02/04/2023 through 03/07/2024.   Attempt to reach pt. Left a voicemail.

## 2023-03-13 NOTE — Telephone Encounter (Signed)
Spoke to patient. Pt is aware of information below.

## 2023-04-22 ENCOUNTER — Ambulatory Visit: Payer: BC Managed Care – PPO | Admitting: Internal Medicine

## 2023-04-24 ENCOUNTER — Encounter: Payer: Self-pay | Admitting: Internal Medicine

## 2023-04-24 ENCOUNTER — Ambulatory Visit: Payer: BC Managed Care – PPO | Admitting: Internal Medicine

## 2023-04-24 ENCOUNTER — Other Ambulatory Visit: Payer: Self-pay | Admitting: Internal Medicine

## 2023-04-24 VITALS — BP 130/82 | HR 63 | Temp 97.6°F | Ht 71.0 in | Wt 205.2 lb

## 2023-04-24 DIAGNOSIS — Z79899 Other long term (current) drug therapy: Secondary | ICD-10-CM | POA: Diagnosis not present

## 2023-04-24 DIAGNOSIS — E119 Type 2 diabetes mellitus without complications: Secondary | ICD-10-CM

## 2023-04-24 DIAGNOSIS — Z125 Encounter for screening for malignant neoplasm of prostate: Secondary | ICD-10-CM

## 2023-04-24 DIAGNOSIS — Z0000 Encounter for general adult medical examination without abnormal findings: Secondary | ICD-10-CM

## 2023-04-24 DIAGNOSIS — I10 Essential (primary) hypertension: Secondary | ICD-10-CM

## 2023-04-24 DIAGNOSIS — E785 Hyperlipidemia, unspecified: Secondary | ICD-10-CM

## 2023-04-24 LAB — POCT GLYCOSYLATED HEMOGLOBIN (HGB A1C): Hemoglobin A1C: 7.9 % — AB (ref 4.0–5.6)

## 2023-04-24 MED ORDER — DAPAGLIFLOZIN PROPANEDIOL 5 MG PO TABS: ORAL_TABLET | ORAL | 3 refills | Status: DC

## 2023-04-24 NOTE — Patient Instructions (Addendum)
Good to see you today   Apologies that you didn't get your farxiga refilled.   Let us know in my chart and call if not getting your medication.  A1c is 7.9  Expect to improve  back on meds.Marland Kitchen

## 2023-04-24 NOTE — Progress Notes (Signed)
Chief Complaint  Patient presents with   Follow-up   Medical Management of Chronic Issues    Follow up on A1c. Pt states he has not started [place] as pharmacy states they are waiting for action from [place].     HPI: David Copeland 62 y.o. come in for Chronic disease management  DM  ran out of farxiga   a few months ago  Had calf injury and nd dec activity for a short while but back to exercise . Mail pharmacy said needed a new scrip for farxiga and didn't get it? Bp  ok  ROS: See pertinent positives and negatives per HPI.  Past Medical History:  Diagnosis Date   Diabetes mellitus without complication    pre-DM   History of diverticulitis of colon 07/30/2004   Hyperglycemia    Hyperlipidemia    Hypertension    off med after lifestyle intervention contolled    Family History  Problem Relation Age of Onset   Breast cancer Mother    Stroke Father        in hot weather   Stroke Other    Arthritis Neg Hx    Colon cancer Neg Hx    Colon polyps Neg Hx    Esophageal cancer Neg Hx    Stomach cancer Neg Hx    Rectal cancer Neg Hx     Social History   Socioeconomic History   Marital status: Married    Spouse name: Not on file   Number of children: Not on file   Years of education: Not on file   Highest education level: Not on file  Occupational History   Not on file  Tobacco Use   Smoking status: Never   Smokeless tobacco: Never  Vaping Use   Vaping Use: Never used  Substance and Sexual Activity   Alcohol use: Yes    Alcohol/week: 5.0 standard drinks of alcohol    Types: 5 Standard drinks or equivalent per week   Drug use: No   Sexual activity: Not on file  Other Topics Concern   Not on file  Social History Narrative   Married with children   hhof 5   2 dogs    Former smoker   Regular exercise-yes  does marathon    Is [place] as homeland   No est. Runs   Sleep adequate   40- 50 jours per week.                  Social Determinants of Health    Financial Resource Strain: Not on file  Food Insecurity: Not on file  Transportation Needs: Not on file  Physical Activity: Not on file  Stress: Not on file  Social Connections: Not on file    Outpatient Medications Prior to Visit  Medication Sig Dispense Refill   aspirin 81 MG tablet Take 81 mg by mouth daily.     lisinopril (ZESTRIL) 5 MG tablet TAKE 1 TABLET DAILY 90 tablet 3   metFORMIN (GLUCOPHAGE) 500 MG tablet TAKE 4 TABLETS DAILY 360 tablet 3   rosuvastatin (CRESTOR) 20 MG tablet TAKE 1 TABLET DAILY 90 tablet 3   FARXIGA 5 MG TABS tablet TAKE 1 TABLET DAILY (TO CONTINUE METFORMIN) (Patient not taking: Reported on 04/24/2023) 90 tablet 3   No facility-administered medications prior to visit.     EXAM:  BP 130/82 (BP Location: Left Arm, Patient Position: Sitting, Cuff Size: Large)   Pulse 63   Temp 97.6 F (36.4  C) (Oral)   Ht  (1.803 m)   Wt 205 lb 3.2 oz (93.1 kg)   SpO2 100%   BMI 28.62 kg/m   Body mass index is 28.62 kg/m. Wt Readings from Last 3 Encounters:  04/24/23 205 lb 3.2 oz (93.1 kg)  11/19/22 190 lb 9.6 oz (86.5 kg)  10/01/22 195 lb (88.5 kg)    GENERAL: vitals reviewed and listed above, alert, oriented, appears well hydrated and in no acute distress looks well  HEENT: atraumatic, conjunctiva  clear, no obvious abnormalities on inspection of external nose and earsNECK: no obvious masses on inspection palpation  MS: moves all extremities without noticeable focal  abnormality PSYCH: pleasant and cooperative, no obvious depression or anxiety Lab Results  Component Value Date   WBC 6.1 08/19/2022   HGB 15.7 08/19/2022   HCT 47.0 08/19/2022   PLT 202.0 08/19/2022   GLUCOSE 139 (H) 08/19/2022   CHOL 170 08/19/2022   TRIG 67.0 08/19/2022   HDL 63.10 08/19/2022   LDLCALC 94 08/19/2022   ALT 21 08/19/2022   AST 24 08/19/2022   NA 141 08/19/2022   K 4.9 08/19/2022   CL 104 08/19/2022   CREATININE 0.98 08/19/2022   BUN 16 08/19/2022   CO2  29 08/19/2022   TSH 1.78 07/14/2020   PSA 1.56 08/19/2022   HGBA1C 7.9 (A) 04/24/2023   MICROALBUR 0.9 08/19/2022   BP Readings from Last 3 Encounters:  04/24/23 130/82  11/19/22 (!) 140/80  10/01/22 123/78    ASSESSMENT AND PLAN:  Discussed the following assessment and plan:  Diabetes mellitus with coincident hypertension - Plan: POC HgB A1c, dapagliflozin propanediol (FARXIGA) 5 MG TABS tablet  Essential hypertension  Medication management A1c up to 7.9 out of  farxiga and may be dec activity but  should improve  back on .  [place]ples of 14 days given to patient  Plan lab and pce in September or thereabouts I will place lab orders    -Patient advised to return or notify health care team  if  new concerns arise.  Patient Instructions  Good to see you today   Apologies that you didn't get your farxiga refilled.   Let us know in my chart and call if not getting your medication.  A1c is 7.9  Expect to improve  back on meds.Neta Mends. Bricyn Labrada M.D.

## 2023-04-28 ENCOUNTER — Encounter: Payer: Self-pay | Admitting: Internal Medicine

## 2023-04-28 DIAGNOSIS — I10 Essential (primary) hypertension: Secondary | ICD-10-CM

## 2023-04-29 MED ORDER — FARXIGA 5 MG PO TABS: ORAL_TABLET | ORAL | 3 refills | Status: DC

## 2023-08-14 ENCOUNTER — Encounter (INDEPENDENT_AMBULATORY_CARE_PROVIDER_SITE_OTHER): Payer: Self-pay

## 2023-09-09 ENCOUNTER — Other Ambulatory Visit (INDEPENDENT_AMBULATORY_CARE_PROVIDER_SITE_OTHER): Payer: BC Managed Care – PPO

## 2023-09-09 DIAGNOSIS — Z0000 Encounter for general adult medical examination without abnormal findings: Secondary | ICD-10-CM | POA: Diagnosis not present

## 2023-09-09 DIAGNOSIS — Z125 Encounter for screening for malignant neoplasm of prostate: Secondary | ICD-10-CM | POA: Diagnosis not present

## 2023-09-09 DIAGNOSIS — Z79899 Other long term (current) drug therapy: Secondary | ICD-10-CM

## 2023-09-09 DIAGNOSIS — I10 Essential (primary) hypertension: Secondary | ICD-10-CM

## 2023-09-09 DIAGNOSIS — E785 Hyperlipidemia, unspecified: Secondary | ICD-10-CM | POA: Diagnosis not present

## 2023-09-09 DIAGNOSIS — E119 Type 2 diabetes mellitus without complications: Secondary | ICD-10-CM | POA: Diagnosis not present

## 2023-09-09 LAB — HEPATIC FUNCTION PANEL
ALT: 21 U/L (ref 0–53)
AST: 22 U/L (ref 0–37)
Albumin: 4.1 g/dL (ref 3.5–5.2)
Alkaline Phosphatase: 50 U/L (ref 39–117)
Bilirubin, Direct: 0.1 mg/dL (ref 0.0–0.3)
Total Bilirubin: 0.7 mg/dL (ref 0.2–1.2)
Total Protein: 6.6 g/dL (ref 6.0–8.3)

## 2023-09-09 LAB — BASIC METABOLIC PANEL
BUN: 17 mg/dL (ref 6–23)
CO2: 28 meq/L (ref 19–32)
Calcium: 9.2 mg/dL (ref 8.4–10.5)
Chloride: 104 meq/L (ref 96–112)
Creatinine, Ser: 0.89 mg/dL (ref 0.40–1.50)
GFR: 93.56 mL/min (ref >60.00)
Glucose, Bld: 133 mg/dL — ABNORMAL HIGH (ref 70–99)
Potassium: 4.3 meq/L (ref 3.5–5.1)
Sodium: 140 meq/L (ref 135–145)

## 2023-09-09 LAB — MICROALBUMIN / CREATININE URINE RATIO
Creatinine,U: 95.1 mg/dL
Microalb Creat Ratio: 0.8 mg/g (ref 0.0–30.0)
Microalb, Ur: 0.7 mg/dL (ref 0.0–1.9)

## 2023-09-09 LAB — LIPID PANEL
Cholesterol: 159 mg/dL (ref 0–200)
HDL: 65.8 mg/dL (ref >39.00)
LDL Cholesterol: 69 mg/dL (ref 0–99)
NonHDL: 92.73
Total CHOL/HDL Ratio: 2
Triglycerides: 117 mg/dL (ref 0.0–149.0)
VLDL: 23.4 mg/dL (ref 0.0–40.0)

## 2023-09-09 LAB — CBC WITH DIFFERENTIAL/PLATELET
Basophils Absolute: 0 10*3/uL (ref 0.0–0.1)
Basophils Relative: 0.3 % (ref 0.0–3.0)
Eosinophils Absolute: 0.1 10*3/uL (ref 0.0–0.7)
Eosinophils Relative: 2.2 % (ref 0.0–5.0)
HCT: 45.6 % (ref 39.0–52.0)
Hemoglobin: 14.9 g/dL (ref 13.0–17.0)
Lymphocytes Relative: 36.3 % (ref 12.0–46.0)
Lymphs Abs: 1.8 10*3/uL (ref 0.7–4.0)
MCHC: 32.7 g/dL (ref 30.0–36.0)
MCV: 89.5 fl (ref 78.0–100.0)
Monocytes Absolute: 0.3 10*3/uL (ref 0.1–1.0)
Monocytes Relative: 6.7 % (ref 3.0–12.0)
Neutro Abs: 2.7 10*3/uL (ref 1.4–7.7)
Neutrophils Relative %: 54.5 % (ref 43.0–77.0)
Platelets: 193 10*3/uL (ref 150.0–400.0)
RBC: 5.1 Mil/uL (ref 4.22–5.81)
RDW: 14.1 % (ref 11.5–15.5)
WBC: 5 10*3/uL (ref 4.0–10.5)

## 2023-09-09 LAB — TSH: TSH: 3.36 u[IU]/mL (ref 0.35–5.50)

## 2023-09-09 LAB — HEMOGLOBIN A1C: Hgb A1c MFr Bld: 7.3 % — ABNORMAL HIGH (ref 4.6–6.5)

## 2023-09-09 LAB — PSA: PSA: 1.58 ng/mL (ref 0.10–4.00)

## 2023-09-15 NOTE — Progress Notes (Unsigned)
No chief complaint on file.   HPI: Patient  David Copeland  62 y.o. comes in today for Preventive Health Care visit  And med managemtne DM BP HLD   Health Maintenance  Topic Date Due   OPHTHALMOLOGY EXAM  Never done   HIV Screening  Never done   Zoster Vaccines- Shingrix (1 of 2) Never done   INFLUENZA VACCINE  07/31/2023   COVID-19 Vaccine (4 - 2023-24 season) 08/31/2023   FOOT EXAM  11/20/2023   HEMOGLOBIN A1C  03/08/2024   Diabetic kidney evaluation - eGFR measurement  09/08/2024   Diabetic kidney evaluation - Urine ACR  09/08/2024   Colonoscopy  10/02/2027   DTaP/Tdap/Td (3 - Tdap) 10/09/2030   Hepatitis C Screening  Completed   HPV VACCINES  Aged Out   Health Maintenance Review LIFESTYLE:  Exercise:   Tobacco/ETS: Alcohol:  Sugar beverages: Sleep: Drug use: no HH of  Work:    ROS:  GEN/ HEENT: No fever, significant weight changes sweats headaches vision problems hearing changes, CV/ PULM; No chest pain shortness of breath cough, syncope,edema  change in exercise tolerance. GI /GU: No adominal pain, vomiting, change in bowel habits. No blood in the stool. No significant GU symptoms. SKIN/HEME: ,no acute skin rashes suspicious lesions or bleeding. No lymphadenopathy, nodules, masses.  NEURO/ PSYCH:  No neurologic signs such as weakness numbness. No depression anxiety. IMM/ Allergy: No unusual infections.  Allergy .   REST of 12 system review negative except as per HPI   Past Medical History:  Diagnosis Date   Diabetes mellitus without complication (HCC)    pre-DM   History of diverticulitis of colon 07/30/2004   Hyperglycemia    Hyperlipidemia    Hypertension    off med after lifestyle intervention contolled    Past Surgical History:  Procedure Laterality Date   ADENOIDECTOMY     COLONOSCOPY  2005   Dr.[facility]   removed breast glands     damaged secondary to soccer   TONSILLECTOMY     WISDOM TOOTH EXTRACTION      Family History   Problem Relation Age of Onset   Breast cancer Mother    Stroke Father        in hot weather   Stroke Other    Arthritis Neg Hx    Colon cancer Neg Hx    Colon polyps Neg Hx    Esophageal cancer Neg Hx    Stomach cancer Neg Hx    Rectal cancer Neg Hx     Social History   Socioeconomic History   Marital status: Married    Spouse name: Not on file   Number of children: Not on file   Years of education: Not on file   Highest education level: Not on file  Occupational History   Not on file  Tobacco Use   Smoking status: Never   Smokeless tobacco: Never  Vaping Use   Vaping status: Never Used  Substance and Sexual Activity   Alcohol use: Yes    Alcohol/week: 5.0 standard drinks of alcohol    Types: 5 Standard drinks or equivalent per week   Drug use: No   Sexual activity: Not on file  Other Topics Concern   Not on file  Social History Narrative   Married with children   hhof 5   2 dogs    Former smoker   Regular exercise-yes  does marathon    Is [place] as [place]   No est.  Runs   Sleep adequate   40- 50 jours per week.                  Social Determinants of Health   Financial Resource Strain: Not on file  Food Insecurity: Not on file  Transportation Needs: Not on file  Physical Activity: Not on file  Stress: Not on file  Social Connections: Not on file    Outpatient Medications Prior to Visit  Medication Sig Dispense Refill   aspirin 81 MG tablet Take 81 mg by mouth daily.     FARXIGA 5 MG TABS tablet TAKE 1 TABLET DAILY (TO CONTINUE METFORMIN) brand 90 tablet 3   lisinopril (ZESTRIL) 5 MG tablet TAKE 1 TABLET DAILY 90 tablet 3   metFORMIN (GLUCOPHAGE) 500 MG tablet TAKE 4 TABLETS DAILY 360 tablet 3   rosuvastatin (CRESTOR) 20 MG tablet TAKE 1 TABLET DAILY 90 tablet 3   No facility-administered medications prior to visit.     EXAM:  There were no vitals taken for this visit.  There is no height or weight on file to calculate BMI. Wt Readings  from Last 3 Encounters:  04/24/23 205 lb 3.2 oz (93.1 kg)  11/19/22 190 lb 9.6 oz (86.5 kg)  10/01/22 195 lb (88.5 kg)    Physical Exam: Vital signs reviewed PIR:JJOA is a well-developed well-nourished alert cooperative    who appearsr stated age in no acute distress.  HEENT: normocephalic atraumatic , Eyes: PERRL EOM's full, conjunctiva clear, Nares: paten,t no deformity discharge or tenderness., Ears: no deformity EAC's clear TMs with normal landmarks. Mouth: clear OP, no lesions, edema.  Moist mucous membranes. Dentition in adequate repair. NECK: supple without masses, thyromegaly or bruits. CHEST/PULM:  Clear to auscultation and percussion breath sounds equal no wheeze , rales or rhonchi. No chest wall deformities or tenderness. Breast: normal by inspection . No dimpling, discharge, masses, tenderness or discharge . CV: PMI is nondisplaced, S1 S2 no gallops, murmurs, rubs. Peripheral pulses are full without delay.No JVD .  ABDOMEN: Bowel sounds normal nontender  No guard or rebound, no hepato splenomegal no CVA tenderness.  No hernia. Extremtities:  No clubbing cyanosis or edema, no acute joint swelling or redness no focal atrophy NEURO:  Oriented x3, cranial nerves 3-12 appear to be intact, no obvious focal weakness,gait within normal limits no abnormal reflexes or asymmetrical SKIN: No acute rashes normal turgor, color, no bruising or petechiae. PSYCH: Oriented, good eye contact, no obvious depression anxiety, cognition and judgment appear normal. LN: no cervical axillary inguinal adenopathy  Lab Results  Component Value Date   WBC 5.0 09/09/2023   HGB 14.9 09/09/2023   HCT 45.6 09/09/2023   PLT 193.0 09/09/2023   GLUCOSE 133 (H) 09/09/2023   CHOL 159 09/09/2023   TRIG 117.0 09/09/2023   HDL 65.80 09/09/2023   LDLCALC 69 09/09/2023   ALT 21 09/09/2023   AST 22 09/09/2023   NA 140 09/09/2023   K 4.3 09/09/2023   CL 104 09/09/2023   CREATININE 0.89 09/09/2023   BUN 17  09/09/2023   CO2 28 09/09/2023   TSH 3.36 09/09/2023   PSA 1.58 09/09/2023   HGBA1C 7.3 (H) 09/09/2023   MICROALBUR 0.7 09/09/2023   Diabetic Foot Exam - Simple   No data filed     BP Readings from Last 3 Encounters:  04/24/23 130/82  11/19/22 (!) 140/80  10/01/22 123/78    Lab results reviewed with patient   ASSESSMENT AND PLAN:  Discussed the  following assessment and plan:    ICD-10-CM   1. Visit for preventive health examination  Z00.00     2. Medication management  Z79.899     3. Essential hypertension  I10     4. Diabetes mellitus with coincident hypertension (HCC)  E11.9    I10     5. History of colon polyps  Z86.010     6. Hyperlipidemia, unspecified hyperlipidemia type  E78.5      No follow-ups on file.  Patient Care Team: Blessed Cotham, Neta Mends, MD as PCP - General Charna Elizabeth, MD as Consulting Physician (Gastroenterology) There are no Patient Instructions on file for this visit.  Neta Mends. Shiza Thelen M.D.

## 2023-09-15 NOTE — Progress Notes (Signed)
A1c down to 7.3 ,Will  review results  at upcoming visit

## 2023-09-16 ENCOUNTER — Encounter: Payer: Self-pay | Admitting: Internal Medicine

## 2023-09-16 ENCOUNTER — Ambulatory Visit (INDEPENDENT_AMBULATORY_CARE_PROVIDER_SITE_OTHER): Payer: BC Managed Care – PPO | Admitting: Internal Medicine

## 2023-09-16 VITALS — BP 122/74 | HR 68 | Temp 98.3°F | Ht 70.75 in | Wt 196.4 lb

## 2023-09-16 DIAGNOSIS — I10 Essential (primary) hypertension: Secondary | ICD-10-CM

## 2023-09-16 DIAGNOSIS — Z86010 Personal history of colonic polyps: Secondary | ICD-10-CM | POA: Diagnosis not present

## 2023-09-16 DIAGNOSIS — Z7984 Long term (current) use of oral hypoglycemic drugs: Secondary | ICD-10-CM

## 2023-09-16 DIAGNOSIS — E785 Hyperlipidemia, unspecified: Secondary | ICD-10-CM

## 2023-09-16 DIAGNOSIS — Z23 Encounter for immunization: Secondary | ICD-10-CM | POA: Diagnosis not present

## 2023-09-16 DIAGNOSIS — Z79899 Other long term (current) drug therapy: Secondary | ICD-10-CM

## 2023-09-16 DIAGNOSIS — Z0000 Encounter for general adult medical examination without abnormal findings: Secondary | ICD-10-CM | POA: Diagnosis not present

## 2023-09-16 DIAGNOSIS — E119 Type 2 diabetes mellitus without complications: Secondary | ICD-10-CM | POA: Diagnosis not present

## 2023-09-16 MED ORDER — LISINOPRIL 5 MG PO TABS
5.0000 mg | ORAL_TABLET | Freq: Every day | ORAL | 3 refills | Status: DC
Start: 2023-09-16 — End: 2024-09-15

## 2023-09-16 MED ORDER — METFORMIN HCL 500 MG PO TABS
ORAL_TABLET | ORAL | 3 refills | Status: DC
Start: 2023-09-16 — End: 2024-11-22

## 2023-09-16 MED ORDER — ROSUVASTATIN CALCIUM 20 MG PO TABS
20.0000 mg | ORAL_TABLET | Freq: Every day | ORAL | 3 refills | Status: DC
Start: 2023-09-16 — End: 2024-11-09

## 2023-09-16 NOTE — Patient Instructions (Addendum)
Good to see you today  Continue lifestyle intervention healthy eating and exercise .  And medication. Flu vaccine today  Get shingrix vaccine here or any pharmacy  when convenient as discussed .   Plan rov in  4-6 months  for A1c etc .  We can adjust medication as indicated  Get your eye check as planned.

## 2023-11-02 ENCOUNTER — Emergency Department (HOSPITAL_BASED_OUTPATIENT_CLINIC_OR_DEPARTMENT_OTHER)
Admission: EM | Admit: 2023-11-02 | Discharge: 2023-11-02 | Disposition: A | Discharge status: Home / Self Care | Payer: BC Managed Care – PPO | Attending: Emergency Medicine | Admitting: Emergency Medicine

## 2023-11-02 ENCOUNTER — Encounter (HOSPITAL_BASED_OUTPATIENT_CLINIC_OR_DEPARTMENT_OTHER): Payer: Self-pay

## 2023-11-02 ENCOUNTER — Other Ambulatory Visit: Payer: Self-pay

## 2023-11-02 DIAGNOSIS — Z79899 Other long term (current) drug therapy: Secondary | ICD-10-CM | POA: Diagnosis not present

## 2023-11-02 DIAGNOSIS — I10 Essential (primary) hypertension: Secondary | ICD-10-CM | POA: Diagnosis not present

## 2023-11-02 DIAGNOSIS — R1032 Left lower quadrant pain: Secondary | ICD-10-CM | POA: Insufficient documentation

## 2023-11-02 DIAGNOSIS — E119 Type 2 diabetes mellitus without complications: Secondary | ICD-10-CM | POA: Diagnosis not present

## 2023-11-02 DIAGNOSIS — Z7982 Long term (current) use of aspirin: Secondary | ICD-10-CM | POA: Diagnosis not present

## 2023-11-02 DIAGNOSIS — R109 Unspecified abdominal pain: Secondary | ICD-10-CM

## 2023-11-02 DIAGNOSIS — Z7984 Long term (current) use of oral hypoglycemic drugs: Secondary | ICD-10-CM | POA: Diagnosis not present

## 2023-11-02 DIAGNOSIS — R21 Rash and other nonspecific skin eruption: Secondary | ICD-10-CM | POA: Diagnosis not present

## 2023-11-02 LAB — I-STAT VENOUS BLOOD GAS, ED
Acid-Base Excess: 1 mmol/L (ref 0.0–2.0)
Bicarbonate: 27.9 mmol/L (ref 20.0–28.0)
Calcium, Ion: 1.23 mmol/L (ref 1.15–1.40)
HCT: 44 % (ref 39.0–52.0)
Hemoglobin: 15 g/dL (ref 13.0–17.0)
O2 Saturation: 39 %
Patient temperature: 98.6
Potassium: 4.1 mmol/L (ref 3.5–5.1)
Sodium: 140 mmol/L (ref 135–145)
TCO2: 29 mmol/L (ref 22–32)
pCO2, Ven: 50.6 mm[Hg] (ref 44–60)
pH, Ven: 7.349 (ref 7.25–7.43)
pO2, Ven: 24 mm[Hg] — CL (ref 32–45)

## 2023-11-02 LAB — CBC WITH DIFFERENTIAL/PLATELET
Abs Immature Granulocytes: 0.02 10*3/uL (ref 0.00–0.07)
Basophils Absolute: 0 10*3/uL (ref 0.0–0.1)
Basophils Relative: 0 %
Eosinophils Absolute: 0.1 10*3/uL (ref 0.0–0.5)
Eosinophils Relative: 2 %
HCT: 45.3 % (ref 39.0–52.0)
Hemoglobin: 15.2 g/dL (ref 13.0–17.0)
Immature Granulocytes: 0 %
Lymphocytes Relative: 26 %
Lymphs Abs: 1.6 10*3/uL (ref 0.7–4.0)
MCH: 29.3 pg (ref 26.0–34.0)
MCHC: 33.6 g/dL (ref 30.0–36.0)
MCV: 87.5 fL (ref 80.0–100.0)
Monocytes Absolute: 0.4 10*3/uL (ref 0.1–1.0)
Monocytes Relative: 7 %
Neutro Abs: 4.1 10*3/uL (ref 1.7–7.7)
Neutrophils Relative %: 65 %
Platelets: 171 10*3/uL (ref 150–400)
RBC: 5.18 MIL/uL (ref 4.22–5.81)
RDW: 12.9 % (ref 11.5–15.5)
WBC: 6.3 10*3/uL (ref 4.0–10.5)
nRBC: 0 % (ref 0.0–0.2)

## 2023-11-02 LAB — URINALYSIS, W/ REFLEX TO CULTURE (INFECTION SUSPECTED)
Bilirubin Urine: NEGATIVE
Glucose, UA: >1000 mg/dL — AB
Hgb urine dipstick: NEGATIVE
Ketones, ur: NEGATIVE mg/dL
Leukocytes,Ua: NEGATIVE
Nitrite: NEGATIVE
Protein, ur: NEGATIVE mg/dL
Specific Gravity, Urine: 1.03 (ref 1.005–1.030)
pH: 6 (ref 5.0–8.0)

## 2023-11-02 LAB — COMPREHENSIVE METABOLIC PANEL
ALT: 20 U/L (ref 0–44)
AST: 16 U/L (ref 15–41)
Albumin: 4.1 g/dL (ref 3.5–5.0)
Alkaline Phosphatase: 49 U/L (ref 38–126)
Anion gap: 6 (ref 5–15)
BUN: 15 mg/dL (ref 6–20)
CO2: 28 mmol/L (ref 22–32)
Calcium: 9.2 mg/dL (ref 8.9–10.3)
Chloride: 104 mmol/L (ref 98–111)
Creatinine, Ser: 0.81 mg/dL (ref 0.61–1.24)
GFR, Estimated: >60 mL/min (ref >60)
Glucose, Bld: 141 mg/dL — ABNORMAL HIGH (ref 70–99)
Potassium: 4 mmol/L (ref 3.5–5.1)
Sodium: 138 mmol/L (ref 135–145)
Total Bilirubin: 1 mg/dL (ref 0.3–1.2)
Total Protein: 6.6 g/dL (ref 6.5–8.1)

## 2023-11-02 LAB — LIPASE, BLOOD: Lipase: 18 U/L (ref 11–51)

## 2023-11-02 LAB — CBG MONITORING, ED: Glucose-Capillary: 141 mg/dL — ABNORMAL HIGH (ref 70–99)

## 2023-11-02 MED ORDER — GABAPENTIN 100 MG PO CAPS: 100.0000 mg | ORAL_CAPSULE | Freq: Three times a day (TID) | ORAL | 0 refills | Status: AC | PRN

## 2023-11-02 MED ORDER — KETOROLAC TROMETHAMINE 15 MG/ML IJ SOLN
15.0000 mg | Freq: Once | INTRAMUSCULAR | Status: AC
  Administered 2023-11-02: 15 mg via INTRAVENOUS
  Filled 2023-11-02: qty 1

## 2023-11-02 MED ORDER — LIDOCAINE 5 % EX PTCH: 1.0000 | MEDICATED_PATCH | CUTANEOUS | 0 refills | Status: AC

## 2023-11-02 MED ORDER — NAPROXEN 375 MG PO TABS: 375.0000 mg | ORAL_TABLET | Freq: Two times a day (BID) | ORAL | 0 refills | Status: AC

## 2023-11-02 MED ORDER — ACYCLOVIR 800 MG PO TABS: 800.0000 mg | ORAL_TABLET | Freq: Every day | ORAL | 0 refills | Status: AC

## 2023-11-02 NOTE — ED Triage Notes (Signed)
PT self ambulated to exam 15 c/o left hip pain 6/10 ache in nature. PT denies injury fever chills. Pt presents with raised red rash to left hip. Pt VSS NAD PT on room air.

## 2023-11-02 NOTE — ED Provider Notes (Signed)
[facility] EMER[facility] DE[facility] AT [facility] Provider Note  CSN: [id number] Arrival date & time: 11/02/23 0865  Chief Complaint(s) Hip Pain  HPI [name] is a 62 y.o. male here with left flank pain for 3 days.  Copeland alleviating or aggravating factors.  Described as a deep ache/burning.  Copeland nausea or vomiting.  Copeland diarrhea.  Copeland urinary symptoms.  Patient developed a rash this evening.  The history is provided by the patient.    Past Medical History Past Medical History:  Diagnosis Date   Diabetes mellitus without complication (HCC)    pre-DM   History of diverticulitis of colon 07/30/2004   Hyperglycemia    Hyperlipidemia    Hypertension    off med after lifestyle intervention contolled   Patient Active Problem List   Diagnosis Date Noted   Diabetes mellitus without complication (HCC) 08/12/2018   Nonallopathic lesion-rib cage 12/14/2014   Tear of intercostal muscle 11/23/2014   Encounter for preventive health examination 08/26/2011   ONYCHOMYCOSIS, TOENAILS 05/15/2009   HYPERLIPIDEMIA 06/10/2007   Essential hypertension 06/10/2007   Impaired fasting glucose 06/10/2007   DIVERTICULITIS, HX OF 06/10/2007   Home Medication(s) Prior to Admission medications   Medication Sig Start Date End Date Taking? Authorizing Provider  acyclovir (ZOVIRAX) 800 MG tablet Take 1 tablet (800 mg total) by mouth 5 (five) times daily for 7 days. 11/02/23 11/09/23 Yes Mettie Roylance, Amadeo Garnet, MD  gabapentin (NEURONTIN) 100 MG capsule Take 1 capsule (100 mg total) by mouth 3 (three) times daily as needed for up to 20 days. 11/02/23 11/22/23 Yes Morgyn Marut, Amadeo Garnet, MD  lidocaine (LIDODERM) 5 % Place 1 patch onto the skin daily. Remove & Discard patch within 12 hours or as directed by MD 11/02/23  Yes Tiarah Shisler, Amadeo Garnet, MD  naproxen (NAPROSYN) 375 MG tablet Take 1 tablet (375 mg total) by mouth 2 (two) times daily. 11/02/23  Yes Avelyn Touch, Amadeo Garnet, MD  aspirin 81 MG  tablet Take 81 mg by mouth daily.    [provider]  FARXIGA 5 MG TABS tablet TAKE 1 TABLET DAILY (TO CONTINUE METFORMIN) brand 04/29/23   Panosh, Neta Mends, MD  lisinopril (ZESTRIL) 5 MG tablet Take 1 tablet (5 mg total) by mouth daily. 09/16/23   Panosh, Neta Mends, MD  metFORMIN (GLUCOPHAGE) 500 MG tablet TAKE 4 TABLETS DAILY 09/16/23   Panosh, Neta Mends, MD  rosuvastatin (CRESTOR) 20 MG tablet Take 1 tablet (20 mg total) by mouth daily. 09/16/23   Panosh, Neta Mends, MD                                                                                                                                    Allergies Atorvastatin  Review of Systems Review of Systems As noted in HPI  Physical Exam Vital Signs  I have reviewed the triage vital signs BP (!) 176/91 (BP Location: Left Arm)   Pulse 60  Temp 98.2 F (36.8 C) (Oral)   Resp 19   Wt 87.1 kg   SpO2 99%   BMI 26.97 kg/m   Physical Exam Vitals reviewed.  Constitutional:      General: He is not in acute distress.    Appearance: He is well-developed. He is not diaphoretic.  HENT:     Head: Normocephalic and atraumatic.     Right Ear: External ear normal.     Left Ear: External ear normal.     Nose: Nose normal.     Mouth/Throat:     Mouth: Mucous membranes are moist.  Eyes:     General: Copeland scleral icterus.    Conjunctiva/sclera: Conjunctivae normal.  Neck:     Trachea: Phonation normal.  Cardiovascular:     Rate and Rhythm: Normal rate and regular rhythm.  Pulmonary:     Effort: Pulmonary effort is normal. Copeland respiratory distress.     Breath sounds: Copeland stridor.  Abdominal:     General: There is Copeland distension.     Tenderness: There is Copeland abdominal tenderness. There is Copeland guarding or rebound.    Musculoskeletal:        General: Normal range of motion.     Cervical back: Normal range of motion.  Neurological:     Mental Status: He is alert and oriented to person, place, and time.  Psychiatric:        Behavior:  Behavior normal.     ED Results and Treatments Labs (all labs ordered are listed, but only abnormal results are displayed) Labs Reviewed  COMPREHENSIVE METABOLIC PANEL - Abnormal; Notable for the following components:      Result Value   Glucose, Bld 141 (*)    All other components within normal limits  URINALYSIS, W/ REFLEX TO CULTURE (INFECTION SUSPECTED) - Abnormal; Notable for the following components:   Glucose, UA >1,000 (*)    Bacteria, UA RARE (*)    All other components within normal limits  CBG MONITORING, ED - Abnormal; Notable for the following components:   Glucose-Capillary 141 (*)    All other components within normal limits  I-STAT VENOUS BLOOD GAS, ED - Abnormal; Notable for the following components:   pO2, Ven 24 (*)    All other components within normal limits  LIPASE, BLOOD  CBC WITH DIFFERENTIAL/PLATELET                                                                                                                         EKG  EKG Interpretation Date/Time:    Ventricular Rate:    PR Interval:    QRS Duration:    QT Interval:    QTC Calculation:   R Axis:      Text Interpretation:         Radiology Copeland results found.  Medications Ordered in ED Medications  ketorolac (TORADOL) 15 MG/ML injection 15 mg (15 mg Intravenous Given 11/02/23 0519)   Procedures Procedures  (including  critical care time) Medical Decision Making / ED Course   Medical Decision Making Amount and/or Complexity of Data Reviewed Labs: ordered.  Risk Prescription drug management.    Left lower quadrant/flank pain workup and differential diagnosis listed below  Favoring shingles.  Given patient's history, will rule out diverticulitis or other intra-abdominal laboratory/infectious process, UTI, diabetic related complication.  CBC without leukocytosis or anemia.  Metabolic panel without significant electrolyte derangements or renal sufficiency.  Copeland evidence of bili  obstruction or pancreatitis.  Mild hyperglycemia without DKA. UA w/o infection.  Suspicion for serious intra-abdominal inflammatory/infectious process at this time is low.  Do not feel that CT scan is necessary at this time.     Final Clinical Impression(s) / ED Diagnoses Final diagnoses:  Left flank pain   The patient appears reasonably screened and/or stabilized for discharge and I doubt any other medical condition or other [facility] requiring further screening, evaluation, or treatment in the ED at this time. I have discussed the findings, Dx and Tx plan with the patient/family who expressed understanding and agree(s) with the plan. Discharge instructions discussed at length. The patient/family was given strict return precautions who verbalized understanding of the instructions. Copeland further questions at time of discharge.  Disposition: Discharge  Condition: Good  ED Discharge Orders          Ordered    acyclovir (ZOVIRAX) 800 MG tablet  5 times daily        11/02/23 0636    naproxen (NAPROSYN) 375 MG tablet  2 times daily        11/02/23 0636    lidocaine (LIDODERM) 5 %  Every 24 hours        11/02/23 0636    gabapentin (NEURONTIN) 100 MG capsule  3 times daily PRN        11/02/23 0636             Follow Up: Madelin Headings, MD [address] [place] [address] [phone]  Call  to schedule an appointment for close follow up    This chart was dictated using voice recognition software.  Despite best efforts to proofread,  errors can occur which can change the documentation meaning.    Nira Conn, MD 11/02/23 [phone]

## 2023-11-02 NOTE — ED Notes (Signed)
RT note: Venous Blood obtained by RN for [facility].

## 2023-11-02 NOTE — ED Notes (Signed)
[facility] ISTAT results remain pending at this time, results are as follows: Ph: 7.349/ PC02: 50.6/ P02: *24*(<<>>)/BE: 1/HC03: 27.9/TC02: 29/S02: 39%, MD made aware, POC ISTAT Pended for results to be resent.

## 2023-11-17 NOTE — Progress Notes (Unsigned)
Virtual Visit via Video Note  I connected with David Copeland on 11/18/23 at 10:15 AM EST by a video enabled telemedicine application and verified that I am speaking with the correct person using two identifiers. Location patient: home Location provider:work office Persons participating in the virtual visit: patient, provider  Patient aware  of the limitations of evaluation and management by telemedicine and  availability of in person appointments. and agreed to proceed.   HPI: Yeremiah Figaro Norgard presents for video visit fu from ed visit   for l eft abd pain  and rash cw early zoster  since them pain is better     as needed naproxyn with help   Lidocaine cause  irritation,   taking gabapentin Uncertain   if helping with pain . Still some swelling  but getting better  and pain is a lot better than when went to ED  ? About  getting shingrix in future   ROS: See pertinent positives and negatives per HPI.  Past Medical History:  Diagnosis Date   Diabetes mellitus without complication (HCC)    pre-DM   History of diverticulitis of colon 07/30/2004   Hyperglycemia    Hyperlipidemia    Hypertension    off med after lifestyle intervention contolled    Past Surgical History:  Procedure Laterality Date   ADENOIDECTOMY     COLONOSCOPY  2005   Dr.[facility]   removed breast glands     damaged secondary to soccer   TONSILLECTOMY     WISDOM TOOTH EXTRACTION      Family History  Problem Relation Age of Onset   Breast cancer Mother    Stroke Father        in hot weather   Stroke Other    Arthritis Neg Hx    Colon cancer Neg Hx    Colon polyps Neg Hx    Esophageal cancer Neg Hx    Stomach cancer Neg Hx    Rectal cancer Neg Hx     Social History   Tobacco Use   Smoking status: Never   Smokeless tobacco: Never  Vaping Use   Vaping status: Never Used  Substance Use Topics   Alcohol use: Yes    Alcohol/week: 5.0 standard drinks of alcohol    Types: 5 Standard  drinks or equivalent per week   Drug use: No      Current Outpatient Medications:    aspirin 81 MG tablet, Take 81 mg by mouth daily., Disp: , Rfl:    FARXIGA 5 MG TABS tablet, TAKE 1 TABLET DAILY (TO CONTINUE METFORMIN) brand, Disp: 90 tablet, Rfl: 3   gabapentin (NEURONTIN) 100 MG capsule, Take 1 capsule (100 mg total) by mouth 3 (three) times daily as needed for up to 20 days., Disp: 60 capsule, Rfl: 0   lisinopril (ZESTRIL) 5 MG tablet, Take 1 tablet (5 mg total) by mouth daily., Disp: 90 tablet, Rfl: 3   metFORMIN (GLUCOPHAGE) 500 MG tablet, TAKE 4 TABLETS DAILY, Disp: 360 tablet, Rfl: 3   rosuvastatin (CRESTOR) 20 MG tablet, Take 1 tablet (20 mg total) by mouth daily., Disp: 90 tablet, Rfl: 3   lidocaine (LIDODERM) 5 %, Place 1 patch onto the skin daily. Remove & Discard patch within 12 hours or as directed by MD (Patient not taking: Reported on 11/18/2023), Disp: 30 patch, Rfl: 0   naproxen (NAPROSYN) 375 MG tablet, Take 1 tablet (375 mg total) by mouth 2 (two) times daily. (Patient not taking: Reported  on 11/18/2023), Disp: 20 tablet, Rfl: 0  EXAM: BP Readings from Last 3 Encounters:  11/02/23 (!) 150/90  09/16/23 122/74  04/24/23 130/82    VITALS per patient if applicable:  GENERAL: alert, oriented, appears well and in no acute distress HEENT: atraumatic, conjunttiva clear, no obvious abnormalities on inspection of external nose and ears NECK: normal movements of the head and neck LUNGS: on inspection no signs of respiratory distress, breathing rate appears normal, no obvious gross SOB, gasping or wheezing CV: no obvious cyanosis MS: moves all visible extremities without noticeable abnormality PSYCH/NEURO: pleasant and cooperative, no obvious depression or anxiety, speech and thought processing grossly intact Lab Results  Component Value Date   WBC 6.3 11/02/2023   HGB 15.0 11/02/2023   HCT 44.0 11/02/2023   PLT 171 11/02/2023   GLUCOSE 141 (H) 11/02/2023   CHOL 159  09/09/2023   TRIG 117.0 09/09/2023   HDL 65.80 09/09/2023   LDLCALC 69 09/09/2023   ALT 20 11/02/2023   AST 16 11/02/2023   NA 140 11/02/2023   K 4.1 11/02/2023   CL 104 11/02/2023   CREATININE 0.81 11/02/2023   BUN 15 11/02/2023   CO2 28 11/02/2023   TSH 3.36 09/09/2023   PSA 1.58 09/09/2023   HGBA1C 7.3 (H) 09/09/2023   MICROALBUR 0.7 09/09/2023   Ed labs and eval reviewed  ASSESSMENT AND PLAN:  Discussed the following assessment and plan:    ICD-10-CM   1. Herpes zoster without complication  B02.9     2. Medication management  Z79.899       Counseled.  Pain meds reviewed and as tolerated  nsaids if needed but caution  . Cn try off gaba and use if needed sine doesn't change course of illness.  Can do baseline pix and get back with [place] if  worse or concerns. Get shingles vaccine  uncertain when but suggest no earlier than 6-12 months since presumed  bump in immunity  after an outbreak .   Can discuss in Feb when has  visit   Expectant management and discussion of plan and treatment with opportunity to ask questions and all were answered. The patient agreed with the plan and demonstrated an understanding of the instructions.   Advised to call back or seek an in-person evaluation if worsening  or having  further concerns  in interim. Return if symptoms worsen or fail to improve as expected, for when planned.    Berniece Andreas, MD

## 2023-11-18 ENCOUNTER — Telehealth: Payer: BC Managed Care – PPO | Admitting: Internal Medicine

## 2023-11-18 ENCOUNTER — Encounter: Payer: Self-pay | Admitting: Internal Medicine

## 2023-11-18 VITALS — Ht 70.75 in | Wt 192.0 lb

## 2023-11-18 DIAGNOSIS — Z79899 Other long term (current) drug therapy: Secondary | ICD-10-CM

## 2023-11-18 DIAGNOSIS — B029 Zoster without complications: Secondary | ICD-10-CM

## 2023-11-29 DIAGNOSIS — M25512 Pain in left shoulder: Secondary | ICD-10-CM | POA: Diagnosis not present

## 2023-12-17 DIAGNOSIS — M25512 Pain in left shoulder: Secondary | ICD-10-CM | POA: Diagnosis not present

## 2024-02-10 NOTE — Progress Notes (Unsigned)
No chief complaint on file.   HPI: David Copeland 63 y.o. come in for Chronic disease management   FU DM :  HT  ROS: See pertinent positives and negatives per HPI.  Past Medical History:  Diagnosis Date   Diabetes mellitus without complication (HCC)    pre-DM   History of diverticulitis of colon 07/30/2004   Hyperglycemia    Hyperlipidemia    Hypertension    off med after lifestyle intervention contolled    Family History  Problem Relation Age of Onset   Breast cancer Mother    Stroke Father        in hot weather   Stroke Other    Arthritis Neg Hx    Colon cancer Neg Hx    Colon polyps Neg Hx    Esophageal cancer Neg Hx    Stomach cancer Neg Hx    Rectal cancer Neg Hx     Social History   Socioeconomic History   Marital status: Married    Spouse name: Not on file   Number of children: Not on file   Years of education: Not on file   Highest education level: Bachelor's degree (e.g., BA, AB, BS)  Occupational History   Not on file  Tobacco Use   Smoking status: Never   Smokeless tobacco: Never  Vaping Use   Vaping status: Never Used  Substance and Sexual Activity   Alcohol use: Yes    Alcohol/week: 5.0 standard drinks of alcohol    Types: 5 Standard drinks or equivalent per week   Drug use: No   Sexual activity: Not on file  Other Topics Concern   Not on file  Social History Narrative   Married with children   hhof 5   2 dogs    Former smoker   Regular exercise-yes  does marathon    Is [place] as homeland   No est. Runs   Sleep adequate   40- 50 jours per week.                  Social Drivers of Corporate investment banker Strain: Low Risk  (02/10/2024)   Overall Financial Resource Strain (CARDIA)    Difficulty of Paying Living Expenses: Not hard at all  Food Insecurity: No Food Insecurity (02/10/2024)   Hunger Vital Sign    Worried About Running Out of Food in the Last Year: Never true    Ran Out of Food in the Last Year: Never true   Transportation Needs: No Transportation Needs (02/10/2024)   PRAPARE - Administrator, Civil Service (Medical): No    Lack of Transportation (Non-Medical): No  Physical Activity: Sufficiently Active (02/10/2024)   Exercise Vital Sign    Days of Exercise per Week: 5 days    Minutes of Exercise per Session: 70 min  Stress: No Stress Concern Present (02/10/2024)   [employer] of Occupational Health - Occupational Stress Questionnaire    Feeling of Stress : Not at all  Social Connections: Unknown (02/10/2024)   Social Connection and Isolation Panel [NHANES]    Frequency of Communication with Friends and Family: Patient declined    Frequency of Social Gatherings with Friends and Family: Patient declined    Attends Religious Services: Patient declined    Database administrator or Organizations: Patient declined    Attends Banker Meetings: Never    Marital Status: Married    Outpatient Medications Prior to Visit  Medication  Sig Dispense Refill   aspirin 81 MG tablet Take 81 mg by mouth daily.     FARXIGA 5 MG TABS tablet TAKE 1 TABLET DAILY (TO CONTINUE METFORMIN) brand 90 tablet 3   lidocaine (LIDODERM) 5 % Place 1 patch onto the skin daily. Remove & Discard patch within 12 hours or as directed by MD (Patient not taking: Reported on 11/18/2023) 30 patch 0   lisinopril (ZESTRIL) 5 MG tablet Take 1 tablet (5 mg total) by mouth daily. 90 tablet 3   metFORMIN (GLUCOPHAGE) 500 MG tablet TAKE 4 TABLETS DAILY 360 tablet 3   naproxen (NAPROSYN) 375 MG tablet Take 1 tablet (375 mg total) by mouth 2 (two) times daily. (Patient not taking: Reported on 11/18/2023) 20 tablet 0   rosuvastatin (CRESTOR) 20 MG tablet Take 1 tablet (20 mg total) by mouth daily. 90 tablet 3   No facility-administered medications prior to visit.     EXAM:  There were no vitals taken for this visit.  There is no height or weight on file to calculate BMI. Wt Readings from Last 3 Encounters:   11/18/23 192 lb (87.1 kg)  11/02/23 192 lb (87.1 kg)  09/16/23 196 lb 6.4 oz (89.1 kg)   BP Readings from Last 3 Encounters:  11/02/23 (!) 150/90  09/16/23 122/74  04/24/23 130/82     GENERAL: vitals reviewed and listed above, alert, oriented, appears well hydrated and in no acute distress HEENT: atraumatic, conjunctiva  clear, no obvious abnormalities on inspection of external nose and ears OP : no lesion edema or exudate  NECK: no obvious masses on inspection palpation  LUNGS: clear to auscultation bilaterally, no wheezes, rales or rhonchi, good air movement CV: HRRR, no clubbing cyanosis or  peripheral edema nl cap refill  MS: moves all extremities without noticeable focal  abnormality PSYCH: pleasant and cooperative, no obvious depression or anxiety Lab Results  Component Value Date   WBC 6.3 11/02/2023   HGB 15.0 11/02/2023   HCT 44.0 11/02/2023   PLT 171 11/02/2023   GLUCOSE 141 (H) 11/02/2023   CHOL 159 09/09/2023   TRIG 117.0 09/09/2023   HDL 65.80 09/09/2023   LDLCALC 69 09/09/2023   ALT 20 11/02/2023   AST 16 11/02/2023   NA 140 11/02/2023   K 4.1 11/02/2023   CL 104 11/02/2023   CREATININE 0.81 11/02/2023   BUN 15 11/02/2023   CO2 28 11/02/2023   TSH 3.36 09/09/2023   PSA 1.58 09/09/2023   HGBA1C 7.3 (H) 09/09/2023   MICROALBUR 0.7 09/09/2023   BP Readings from Last 3 Encounters:  11/02/23 (!) 150/90  09/16/23 122/74  04/24/23 130/82    ASSESSMENT AND PLAN:  Discussed the following assessment and plan:  No diagnosis found.  -Patient advised to return or notify health care team  if  new concerns arise.  There are no Patient Instructions on file for this visit.   Neta Mends. Goble Fudala M.D.

## 2024-02-11 ENCOUNTER — Other Ambulatory Visit: Payer: Self-pay | Admitting: Internal Medicine

## 2024-02-11 ENCOUNTER — Telehealth: Payer: Self-pay | Admitting: Internal Medicine

## 2024-02-11 ENCOUNTER — Ambulatory Visit: Payer: BC Managed Care – PPO | Admitting: Internal Medicine

## 2024-02-11 ENCOUNTER — Encounter: Payer: Self-pay | Admitting: Internal Medicine

## 2024-02-11 VITALS — BP 160/90 | HR 62 | Temp 98.1°F | Ht 70.75 in | Wt 193.8 lb

## 2024-02-11 DIAGNOSIS — I10 Essential (primary) hypertension: Secondary | ICD-10-CM

## 2024-02-11 DIAGNOSIS — E119 Type 2 diabetes mellitus without complications: Secondary | ICD-10-CM

## 2024-02-11 DIAGNOSIS — Z125 Encounter for screening for malignant neoplasm of prostate: Secondary | ICD-10-CM

## 2024-02-11 DIAGNOSIS — E785 Hyperlipidemia, unspecified: Secondary | ICD-10-CM

## 2024-02-11 DIAGNOSIS — Z0000 Encounter for general adult medical examination without abnormal findings: Secondary | ICD-10-CM

## 2024-02-11 DIAGNOSIS — Z79899 Other long term (current) drug therapy: Secondary | ICD-10-CM | POA: Diagnosis not present

## 2024-02-11 DIAGNOSIS — Z7984 Long term (current) use of oral hypoglycemic drugs: Secondary | ICD-10-CM | POA: Diagnosis not present

## 2024-02-11 DIAGNOSIS — L989 Disorder of the skin and subcutaneous tissue, unspecified: Secondary | ICD-10-CM

## 2024-02-11 LAB — POCT GLYCOSYLATED HEMOGLOBIN (HGB A1C): Hemoglobin A1C: 6.7 % — AB (ref 4.0–5.6)

## 2024-02-11 MED ORDER — DAPAGLIFLOZIN PROPANEDIOL 10 MG PO TABS: 10.0000 mg | ORAL_TABLET | Freq: Every day | ORAL | 2 refills | Status: DC

## 2024-02-11 NOTE — Telephone Encounter (Signed)
Called pt and left vm to sch lab. Please sch pt for lab appt a week prior to his physical in Sept.

## 2024-02-11 NOTE — Patient Instructions (Addendum)
Will increase  farxiga to 10 mg per day .  Continue lifestyle intervention healthy eating and exercise .  Follow area possible seborrheic keratosis   on  chin  but may  see dermatology for total skin check .  Bp check   at home for 3 + days  twice a day and send in  results.  We can  always  adjust medication .to ensure control  below 130/80 and below average.

## 2024-09-08 ENCOUNTER — Other Ambulatory Visit: Payer: BC Managed Care – PPO

## 2024-09-08 DIAGNOSIS — E119 Type 2 diabetes mellitus without complications: Secondary | ICD-10-CM | POA: Diagnosis not present

## 2024-09-08 DIAGNOSIS — Z0000 Encounter for general adult medical examination without abnormal findings: Secondary | ICD-10-CM | POA: Diagnosis not present

## 2024-09-08 DIAGNOSIS — Z79899 Other long term (current) drug therapy: Secondary | ICD-10-CM | POA: Diagnosis not present

## 2024-09-08 DIAGNOSIS — I10 Essential (primary) hypertension: Secondary | ICD-10-CM | POA: Diagnosis not present

## 2024-09-08 DIAGNOSIS — Z125 Encounter for screening for malignant neoplasm of prostate: Secondary | ICD-10-CM

## 2024-09-08 DIAGNOSIS — E785 Hyperlipidemia, unspecified: Secondary | ICD-10-CM

## 2024-09-08 LAB — BASIC METABOLIC PANEL WITH GFR
BUN: 13 mg/dL (ref 6–23)
CO2: 29 meq/L (ref 19–32)
Calcium: 9.4 mg/dL (ref 8.4–10.5)
Chloride: 101 meq/L (ref 96–112)
Creatinine, Ser: 0.92 mg/dL (ref 0.40–1.50)
GFR: 90.18 mL/min (ref >60.00)
Glucose, Bld: 186 mg/dL — ABNORMAL HIGH (ref 70–99)
Potassium: 4 meq/L (ref 3.5–5.1)
Sodium: 138 meq/L (ref 135–145)

## 2024-09-08 LAB — TSH: TSH: 2.86 u[IU]/mL (ref 0.35–5.50)

## 2024-09-08 LAB — CBC WITH DIFFERENTIAL/PLATELET
Basophils Absolute: 0 K/uL (ref 0.0–0.1)
Basophils Relative: 0.4 % (ref 0.0–3.0)
Eosinophils Absolute: 0.1 K/uL (ref 0.0–0.7)
Eosinophils Relative: 1.4 % (ref 0.0–5.0)
HCT: 42.5 % (ref 39.0–52.0)
Hemoglobin: 14.4 g/dL (ref 13.0–17.0)
Lymphocytes Relative: 27.2 % (ref 12.0–46.0)
Lymphs Abs: 1.8 K/uL (ref 0.7–4.0)
MCHC: 33.9 g/dL (ref 30.0–36.0)
MCV: 87.6 fl (ref 78.0–100.0)
Monocytes Absolute: 0.4 K/uL (ref 0.1–1.0)
Monocytes Relative: 6.8 % (ref 3.0–12.0)
Neutro Abs: 4.2 K/uL (ref 1.4–7.7)
Neutrophils Relative %: 64.2 % (ref 43.0–77.0)
Platelets: 191 K/uL (ref 150.0–400.0)
RBC: 4.86 Mil/uL (ref 4.22–5.81)
RDW: 13 % (ref 11.5–15.5)
WBC: 6.6 K/uL (ref 4.0–10.5)

## 2024-09-08 LAB — HEPATIC FUNCTION PANEL
ALT: 17 U/L (ref 0–53)
AST: 17 U/L (ref 0–37)
Albumin: 4.4 g/dL (ref 3.5–5.2)
Alkaline Phosphatase: 60 U/L (ref 39–117)
Bilirubin, Direct: 0.2 mg/dL (ref 0.0–0.3)
Total Bilirubin: 0.9 mg/dL (ref 0.2–1.2)
Total Protein: 6.8 g/dL (ref 6.0–8.3)

## 2024-09-08 LAB — MICROALBUMIN / CREATININE URINE RATIO
Creatinine,U: 102.6 mg/dL
Microalb Creat Ratio: 8.6 mg/g (ref 0.0–30.0)
Microalb, Ur: 0.9 mg/dL (ref 0.0–1.9)

## 2024-09-08 LAB — LIPID PANEL
Cholesterol: 162 mg/dL (ref 0–200)
HDL: 58.1 mg/dL (ref >39.00)
LDL Cholesterol: 82 mg/dL (ref 0–99)
NonHDL: 104
Total CHOL/HDL Ratio: 3
Triglycerides: 109 mg/dL (ref 0.0–149.0)
VLDL: 21.8 mg/dL (ref 0.0–40.0)

## 2024-09-08 LAB — VITAMIN B12: Vitamin B-12: 315 pg/mL (ref 211–911)

## 2024-09-08 LAB — PSA: PSA: 1.92 ng/mL (ref 0.10–4.00)

## 2024-09-08 LAB — HEMOGLOBIN A1C: Hgb A1c MFr Bld: 8.6 % — ABNORMAL HIGH (ref 4.6–6.5)

## 2024-09-14 NOTE — Progress Notes (Unsigned)
 No chief complaint on file.   HPI: Patient  David Copeland  63 y.o. comes in today for Preventive Health Care visit   Health Maintenance  Topic Date Due   OPHTHALMOLOGY EXAM  Never done   Zoster Vaccines- Shingrix (1 of 2) Never done   Pneumococcal Vaccine: 50+ Years (2 of 2 - PCV) 06/24/2006   Influenza Vaccine  07/30/2024   COVID-19 Vaccine (4 - 2025-26 season) 08/30/2024   HIV Screening  09/15/2024 (Originally 10/24/1976)   FOOT EXAM  09/15/2024   HEMOGLOBIN A1C  03/08/2025   Diabetic kidney evaluation - eGFR measurement  09/08/2025   Diabetic kidney evaluation - Urine ACR  09/08/2025   Colonoscopy  10/02/2027   DTaP/Tdap/Td (3 - Tdap) 10/09/2030   Hepatitis C Screening  Completed   Hepatitis B Vaccines 19-59 Average Risk  Aged Out   HPV VACCINES  Aged Out   Meningococcal B Vaccine  Aged Out   Health Maintenance Review LIFESTYLE:  Exercise:   Tobacco/ETS: Alcohol:  Sugar beverages: Sleep: Drug use: no HH of  Work:    ROS:  GEN/ HEENT: No fever, significant weight changes sweats headaches vision problems hearing changes, CV/ PULM; No chest pain shortness of breath cough, syncope,edema  change in exercise tolerance. GI /GU: No adominal pain, vomiting, change in bowel habits. No blood in the stool. No significant GU symptoms. SKIN/HEME: ,no acute skin rashes suspicious lesions or bleeding. No lymphadenopathy, nodules, masses.  NEURO/ PSYCH:  No neurologic signs such as weakness numbness. No depression anxiety. IMM/ Allergy: No unusual infections.  Allergy .   REST of 12 system review negative except as per HPI   Past Medical History:  Diagnosis Date   Diabetes mellitus without complication (HCC)    pre-DM   History of diverticulitis of colon 07/30/2004   Hyperglycemia    Hyperlipidemia    Hypertension    off med after lifestyle intervention contolled    Past Surgical History:  Procedure Laterality Date   ADENOIDECTOMY     COLONOSCOPY  2005    Dr.[facility]   removed breast glands     damaged secondary to soccer   TONSILLECTOMY     WISDOM TOOTH EXTRACTION      Family History  Problem Relation Age of Onset   Breast cancer Mother    Stroke Father        in hot weather   Stroke Other    Arthritis Neg Hx    Colon cancer Neg Hx    Colon polyps Neg Hx    Esophageal cancer Neg Hx    Stomach cancer Neg Hx    Rectal cancer Neg Hx     Social History   Socioeconomic History   Marital status: Married    Spouse name: Not on file   Number of children: Not on file   Years of education: Not on file   Highest education level: Bachelor's degree (e.g., BA, AB, BS)  Occupational History   Not on file  Tobacco Use   Smoking status: Never   Smokeless tobacco: Never  Vaping Use   Vaping status: Never Used  Substance and Sexual Activity   Alcohol use: Yes    Alcohol/week: 5.0 standard drinks of alcohol    Types: 5 Standard drinks or equivalent per week   Drug use: No   Sexual activity: Not on file  Other Topics Concern   Not on file  Social History Narrative   Married with children   hhof  5   2 dogs    Former smoker   Regular exercise-yes  does marathon    Is [place] as homeland   No est. Runs   Sleep adequate   40- 50 jours per week.                  Social Drivers of Corporate investment banker Strain: Low Risk  (02/10/2024)   Overall Financial Resource Strain (CARDIA)    Difficulty of Paying Living Expenses: Not hard at all  Food Insecurity: No Food Insecurity (02/10/2024)   Hunger Vital Sign    Worried About Running Out of Food in the Last Year: Never true    Ran Out of Food in the Last Year: Never true  Transportation Needs: No Transportation Needs (02/10/2024)   PRAPARE - Administrator, Civil Service (Medical): No    Lack of Transportation (Non-Medical): No  Physical Activity: Sufficiently Active (02/10/2024)   Exercise Vital Sign    Days of Exercise per Week: 5 days    Minutes of Exercise per  Session: 70 min  Stress: No Stress Concern Present (02/10/2024)   [employer] of Occupational Health - Occupational Stress Questionnaire    Feeling of Stress : Not at all  Social Connections: Unknown (02/10/2024)   Social Connection and Isolation Panel    Frequency of Communication with Friends and Family: Patient declined    Frequency of Social Gatherings with Friends and Family: Patient declined    Attends Religious Services: Patient declined    Database administrator or Organizations: Patient declined    Attends Banker Meetings: Never    Marital Status: Married    Outpatient Medications Prior to Visit  Medication Sig Dispense Refill   aspirin 81 MG tablet Take 81 mg by mouth daily.     dapagliflozin  propanediol (FARXIGA ) 10 MG TABS tablet Take 1 tablet (10 mg total) by mouth daily before breakfast. 90 tablet 2   lidocaine  (LIDODERM ) 5 % Place 1 patch onto the skin daily. Remove & Discard patch within 12 hours or as directed by MD (Patient not taking: Reported on 02/11/2024) 30 patch 0   lisinopril  (ZESTRIL ) 5 MG tablet Take 1 tablet (5 mg total) by mouth daily. 90 tablet 3   metFORMIN  (GLUCOPHAGE ) 500 MG tablet TAKE 4 TABLETS DAILY 360 tablet 3   naproxen  (NAPROSYN ) 375 MG tablet Take 1 tablet (375 mg total) by mouth 2 (two) times daily. (Patient not taking: Reported on 02/11/2024) 20 tablet 0   rosuvastatin  (CRESTOR ) 20 MG tablet Take 1 tablet (20 mg total) by mouth daily. 90 tablet 3   No facility-administered medications prior to visit.     EXAM:  There were no vitals taken for this visit.  There is no height or weight on file to calculate BMI. Wt Readings from Last 3 Encounters:  02/11/24 193 lb 12.8 oz (87.9 kg)  11/18/23 192 lb (87.1 kg)  11/02/23 192 lb (87.1 kg)    Physical Exam: Vital signs reviewed HZW:Uypd is a well-developed well-nourished alert cooperative    who appearsr stated age in no acute distress.  HEENT: normocephalic atraumatic ,  Eyes: PERRL EOM's full, conjunctiva clear, Nares: paten,t no deformity discharge or tenderness., Ears: no deformity EAC's clear TMs with normal landmarks. Mouth: clear OP, no lesions, edema.  Moist mucous membranes. Dentition in adequate repair. NECK: supple without masses, thyromegaly or bruits. CHEST/PULM:  Clear to auscultation and percussion breath sounds equal no wheeze ,  rales or rhonchi. No chest wall deformities or tenderness. CV: PMI is nondisplaced, S1 S2 no gallops, murmurs, rubs. Peripheral pulses are full without delay.No JVD .  ABDOMEN: Bowel sounds normal nontender  No guard or rebound, no hepato splenomegal no CVA tenderness. Extremtities:  No clubbing cyanosis or edema, no acute joint swelling or redness no focal atrophy NEURO:  Oriented x3, cranial nerves 3-12 appear to be intact, no obvious focal weakness,gait within normal limits no abnormal reflexes or asymmetrical SKIN: No acute rashes normal turgor, color, no bruising or petechiae. PSYCH: Oriented, good eye contact, no obvious depression anxiety, cognition and judgment appear normal. LN: no cervical axillary adenopathy  Lab Results  Component Value Date   WBC 6.6 09/08/2024   HGB 14.4 09/08/2024   HCT 42.5 09/08/2024   PLT 191.0 09/08/2024   GLUCOSE 186 (H) 09/08/2024   CHOL 162 09/08/2024   TRIG 109.0 09/08/2024   HDL 58.10 09/08/2024   LDLCALC 82 09/08/2024   ALT 17 09/08/2024   AST 17 09/08/2024   NA 138 09/08/2024   K 4.0 09/08/2024   CL 101 09/08/2024   CREATININE 0.92 09/08/2024   BUN 13 09/08/2024   CO2 29 09/08/2024   TSH 2.86 09/08/2024   PSA 1.92 09/08/2024   HGBA1C 8.6 (H) 09/08/2024   MICROALBUR 0.9 09/08/2024    BP Readings from Last 3 Encounters:  02/11/24 (!) 160/90  11/02/23 (!) 150/90  09/16/23 122/74    Lab results reviewed with patient   ASSESSMENT AND PLAN:  Discussed the following assessment and plan:    ICD-10-CM   1. Encounter for preventive health examination  Z00.00      2. Medication management  Z79.899     3. Essential hypertension  I10     4. Type 2 diabetes mellitus with hyperglycemia, without long-term current use of insulin (HCC)  E11.65     5. Hyperlipidemia, unspecified hyperlipidemia type  E78.5      No follow-ups on file.  Patient Care Team: Janelly Switalski, Apolinar POUR, MD as PCP - General Kristie Lamprey, MD as Consulting Physician (Gastroenterology) There are no Patient Instructions on file for this visit.  Magalie Almon K. Jotham Ahn M.D.

## 2024-09-15 ENCOUNTER — Ambulatory Visit: Payer: BC Managed Care – PPO | Admitting: Internal Medicine

## 2024-09-15 ENCOUNTER — Encounter: Payer: Self-pay | Admitting: Internal Medicine

## 2024-09-15 ENCOUNTER — Telehealth: Payer: Self-pay

## 2024-09-15 VITALS — BP 140/78 | HR 70 | Temp 98.5°F | Ht 71.0 in | Wt 202.4 lb

## 2024-09-15 DIAGNOSIS — Z125 Encounter for screening for malignant neoplasm of prostate: Secondary | ICD-10-CM

## 2024-09-15 DIAGNOSIS — I10 Essential (primary) hypertension: Secondary | ICD-10-CM | POA: Diagnosis not present

## 2024-09-15 DIAGNOSIS — Z23 Encounter for immunization: Secondary | ICD-10-CM

## 2024-09-15 DIAGNOSIS — Z0000 Encounter for general adult medical examination without abnormal findings: Secondary | ICD-10-CM | POA: Diagnosis not present

## 2024-09-15 DIAGNOSIS — E785 Hyperlipidemia, unspecified: Secondary | ICD-10-CM | POA: Diagnosis not present

## 2024-09-15 DIAGNOSIS — Z79899 Other long term (current) drug therapy: Secondary | ICD-10-CM | POA: Diagnosis not present

## 2024-09-15 DIAGNOSIS — E1165 Type 2 diabetes mellitus with hyperglycemia: Secondary | ICD-10-CM

## 2024-09-15 MED ORDER — LISINOPRIL 10 MG PO TABS: 10.0000 mg | ORAL_TABLET | Freq: Every day | ORAL | 3 refills | Status: AC

## 2024-09-15 MED ORDER — EMPAGLIFLOZIN 25 MG PO TABS
25.0000 mg | ORAL_TABLET | Freq: Every day | ORAL | 3 refills | Status: AC
Start: 2024-09-15 — End: ?

## 2024-09-15 NOTE — Progress Notes (Signed)
   09/15/2024  Patient ID: David Copeland, male   DOB: 09/18/1961, 63 y.o.   MRN: [id number]  Contacted [facility] to confirm price of Jardiance  sent by PCP earlier today. Price is $25/30ds. Attempted to contact patient. Left HIPAA compliant message for patient to return my call at their convenience.   Jon VEAR Lindau, PharmD Clinical Pharmacist [phone]

## 2024-09-15 NOTE — Patient Instructions (Signed)
 Good to see you  Inc lisinopril  to 10 mg per day as discussed for better BP control( still a low dose).  Begin jardiance  10 mg per day for a week then increase to 25 mg per day   samples and wil send in to local pharmacy for 30 days and can try the copay cards. If any available on line.  3 style libre   CGM  may be covered by insurance  as rx  with copay card  so  has been helpful for blood sugar monitoring.   Flu shot today . Plan  fu in about 3 months  and go from there.  Let us  know if thisplan isnt working so e dont lose time.

## 2024-10-05 ENCOUNTER — Encounter: Payer: Self-pay | Admitting: Internal Medicine

## 2024-10-05 NOTE — Telephone Encounter (Signed)
 Thanks for the info Goal bp should be the 120/80 range   so whichever med dose gets you there.   Yes we want to avoid low blood glucose ( and the cgm is going to be different causes it measures fluid and has a delay but as you said trends are helpful)  If you are really getting low bg  confirmed by capillary  We may need to decreases the jardiance  back to  10 mg?  But I think herlene tends to run lower than capillary )   Continue inform .  Some insurance will pay for herlene if low bgs and had to take action correct  not sure

## 2024-11-07 ENCOUNTER — Other Ambulatory Visit: Payer: Self-pay | Admitting: Internal Medicine

## 2024-11-07 DIAGNOSIS — I10 Essential (primary) hypertension: Secondary | ICD-10-CM

## 2024-11-07 DIAGNOSIS — E119 Type 2 diabetes mellitus without complications: Secondary | ICD-10-CM

## 2024-11-07 DIAGNOSIS — Z860100 Personal history of colon polyps, unspecified: Secondary | ICD-10-CM

## 2024-11-07 DIAGNOSIS — Z0000 Encounter for general adult medical examination without abnormal findings: Secondary | ICD-10-CM

## 2024-11-07 DIAGNOSIS — E785 Hyperlipidemia, unspecified: Secondary | ICD-10-CM

## 2024-11-07 DIAGNOSIS — Z79899 Other long term (current) drug therapy: Secondary | ICD-10-CM

## 2024-11-21 ENCOUNTER — Other Ambulatory Visit: Payer: Self-pay | Admitting: Internal Medicine

## 2024-11-21 DIAGNOSIS — E119 Type 2 diabetes mellitus without complications: Secondary | ICD-10-CM

## 2024-11-21 DIAGNOSIS — Z0000 Encounter for general adult medical examination without abnormal findings: Secondary | ICD-10-CM

## 2024-11-21 DIAGNOSIS — Z860100 Personal history of colon polyps, unspecified: Secondary | ICD-10-CM

## 2024-11-21 DIAGNOSIS — E785 Hyperlipidemia, unspecified: Secondary | ICD-10-CM

## 2024-11-21 DIAGNOSIS — I10 Essential (primary) hypertension: Secondary | ICD-10-CM

## 2024-11-21 DIAGNOSIS — Z79899 Other long term (current) drug therapy: Secondary | ICD-10-CM

## 2024-12-15 ENCOUNTER — Encounter: Payer: Self-pay | Admitting: Internal Medicine

## 2024-12-15 ENCOUNTER — Ambulatory Visit: Payer: Self-pay | Admitting: Internal Medicine

## 2024-12-15 ENCOUNTER — Ambulatory Visit: Admitting: Internal Medicine

## 2024-12-15 VITALS — BP 128/82 | HR 64 | Temp 97.4°F | Ht 71.0 in | Wt 195.0 lb

## 2024-12-15 DIAGNOSIS — I10 Essential (primary) hypertension: Secondary | ICD-10-CM | POA: Diagnosis not present

## 2024-12-15 DIAGNOSIS — Z79899 Other long term (current) drug therapy: Secondary | ICD-10-CM | POA: Diagnosis not present

## 2024-12-15 DIAGNOSIS — E119 Type 2 diabetes mellitus without complications: Secondary | ICD-10-CM | POA: Diagnosis not present

## 2024-12-15 DIAGNOSIS — Z7984 Long term (current) use of oral hypoglycemic drugs: Secondary | ICD-10-CM

## 2024-12-15 DIAGNOSIS — E785 Hyperlipidemia, unspecified: Secondary | ICD-10-CM

## 2024-12-15 LAB — BASIC METABOLIC PANEL WITH GFR
BUN: 13 mg/dL (ref 6–23)
CO2: 28 meq/L (ref 19–32)
Calcium: 9.9 mg/dL (ref 8.4–10.5)
Chloride: 103 meq/L (ref 96–112)
Creatinine, Ser: 0.9 mg/dL (ref 0.40–1.50)
GFR: 92.42 mL/min (ref >60.00)
Glucose, Bld: 121 mg/dL — ABNORMAL HIGH (ref 70–99)
Potassium: 4 meq/L (ref 3.5–5.1)
Sodium: 139 meq/L (ref 135–145)

## 2024-12-15 LAB — POCT GLYCOSYLATED HEMOGLOBIN (HGB A1C): Hemoglobin A1C: 7.1 % — AB (ref 4.0–5.6)

## 2024-12-15 NOTE — Progress Notes (Signed)
 Chief Complaint  Patient presents with   Medical Management of Chronic Issues    Pt reports he tried the freestyle libre but did not continue it, had a few false low glucose.     HPI: David Copeland 63 y.o. come in for Chronic disease management   Fu bp inc lisinopril   for control reading often below 120 or about 120  no se reported  On jardiance   at first caused fatigue weak feeling but now ok and no inc polyuria  se .   Still running  just got back from active vacation  in [place] ( family)  Herlene sample had 2 low bg alarms  but on cbg was in 140 range . ?  So stopped  did help look at patterns of bg ranges   Fasting are 140 - 150 then 120 pp highest 180con ROS: See pertinent positives and negatives per HPI.  Past Medical History:  Diagnosis Date   Diabetes mellitus without complication (HCC)    pre-DM   History of diverticulitis of colon 07/30/2004   Hyperglycemia    Hyperlipidemia    Hypertension    off med after lifestyle intervention contolled    Family History  Problem Relation Age of Onset   Breast cancer Mother    Stroke Father        in hot weather   Stroke Other    Arthritis Neg Hx    Colon cancer Neg Hx    Colon polyps Neg Hx    Esophageal cancer Neg Hx    Stomach cancer Neg Hx    Rectal cancer Neg Hx     Social History   Socioeconomic History   Marital status: Married    Spouse name: Not on file   Number of children: Not on file   Years of education: Not on file   Highest education level: Bachelor's degree (e.g., BA, AB, BS)  Occupational History   Not on file  Tobacco Use   Smoking status: Never   Smokeless tobacco: Never  Vaping Use   Vaping status: Never Used  Substance and Sexual Activity   Alcohol use: Yes    Alcohol/week: 5.0 standard drinks of alcohol    Types: 5 Standard drinks or equivalent per week   Drug use: No   Sexual activity: Not on file  Other Topics Concern   Not on file  Social History Narrative   Married  with children   hhof 5   2 dogs    Former smoker   Regular exercise-yes  does marathon    Is [place] as homeland   No est. Runs   Sleep adequate   40- 50 jours per week.                  Social Drivers of Health   Tobacco Use: Low Risk (12/15/2024)   Patient History    Smoking Tobacco Use: Never    Smokeless Tobacco Use: Never    Passive Exposure: Not on file  Financial Resource Strain: Low Risk (02/10/2024)   Overall Financial Resource Strain (CARDIA)    Difficulty of Paying Living Expenses: Not hard at all  Food Insecurity: No Food Insecurity (02/10/2024)   Hunger Vital Sign    Worried About Running Out of Food in the Last Year: Never true    Ran Out of Food in the Last Year: Never true  Transportation Needs: No Transportation Needs (02/10/2024)   PRAPARE - Transportation    Lack of  Transportation (Medical): No    Lack of Transportation (Non-Medical): No  Physical Activity: Sufficiently Active (02/10/2024)   Exercise Vital Sign    Days of Exercise per Week: 5 days    Minutes of Exercise per Session: 70 min  Stress: No Stress Concern Present (02/10/2024)   [employer] of Occupational Health - Occupational Stress Questionnaire    Feeling of Stress : Not at all  Social Connections: Unknown (02/10/2024)   Social Connection and Isolation Panel    Frequency of Communication with Friends and Family: Patient declined    Frequency of Social Gatherings with Friends and Family: Patient declined    Attends Religious Services: Patient declined    Active Member of Clubs or Organizations: Patient declined    Attends Banker Meetings: Never    Marital Status: Married  Depression (PHQ2-9): Low Risk (09/15/2024)   Depression (PHQ2-9)    PHQ-2 Score: 0  Alcohol Screen: Low Risk (02/10/2024)   Alcohol Screen    Last Alcohol Screening Score (AUDIT): 3  Housing: Low Risk (02/10/2024)   Housing Stability Vital Sign    Unable to Pay for Housing in the Last Year: No    Number  of Times Moved in the Last Year: 0    Homeless in the Last Year: No  Utilities: Not At Risk (09/16/2023)   AHC Utilities    Threatened with loss of utilities: No  Health Literacy: Adequate Health Literacy (09/16/2023)   B1300 Health Literacy    Frequency of need for help with medical instructions: Never    Outpatient Medications Prior to Visit  Medication Sig Dispense Refill   aspirin 81 MG tablet Take 81 mg by mouth daily.     empagliflozin  (JARDIANCE ) 25 MG TABS tablet Take 1 tablet (25 mg total) by mouth daily before breakfast. 30 tablet 3   lisinopril  (ZESTRIL ) 10 MG tablet Take 1 tablet (10 mg total) by mouth daily. 90 tablet 3   metFORMIN  (GLUCOPHAGE ) 500 MG tablet TAKE 4 TABLETS DAILY 360 tablet 3   rosuvastatin  (CRESTOR ) 20 MG tablet TAKE 1 TABLET DAILY 90 tablet 3   lidocaine  (LIDODERM ) 5 % Place 1 patch onto the skin daily. Remove & Discard patch within 12 hours or as directed by MD (Patient not taking: Reported on 12/15/2024) 30 patch 0   naproxen  (NAPROSYN ) 375 MG tablet Take 1 tablet (375 mg total) by mouth 2 (two) times daily. (Patient not taking: Reported on 12/15/2024) 20 tablet 0   No facility-administered medications prior to visit.     EXAM:  BP 128/82 (BP Location: Left Arm, Patient Position: Sitting, Cuff Size: Large)   Pulse 64   Temp (!) 97.4 F (36.3 C) (Oral)   Ht 5' 11 (1.803 m)   Wt 195 lb (88.5 kg)   SpO2 98%   BMI 27.20 kg/m   Body mass index is 27.2 kg/m.  GENERAL: vitals reviewed and listed above, alert, oriented, appears well hydrated and in no acute distress HEENT: atraumatic, conjunctiva  clear, no obvious abnormalities on inspection of external nose and ears  NECK: no obvious masses on inspection palpation  LUNGS: clear to auscultation bilaterally, no wheezes, rales or rhonchi, good air movement CV: HRRR, no clubbing cyanosis or  peripheral edema nl cap refill  MS: moves all extremities without noticeable focal  abnormality PSYCH:  pleasant and cooperative, no obvious depression or anxiety Lab Results  Component Value Date   WBC 6.6 09/08/2024   HGB 14.4 09/08/2024   HCT 42.5 09/08/2024  PLT 191.0 09/08/2024   GLUCOSE 186 (H) 09/08/2024   CHOL 162 09/08/2024   TRIG 109.0 09/08/2024   HDL 58.10 09/08/2024   LDLCALC 82 09/08/2024   ALT 17 09/08/2024   AST 17 09/08/2024   NA 138 09/08/2024   K 4.0 09/08/2024   CL 101 09/08/2024   CREATININE 0.92 09/08/2024   BUN 13 09/08/2024   CO2 29 09/08/2024   TSH 2.86 09/08/2024   PSA 1.92 09/08/2024   HGBA1C 7.1 (A) 12/15/2024   MICROALBUR 0.9 09/08/2024   BP Readings from Last 3 Encounters:  12/15/24 128/82  09/15/24 (!) 140/78  02/11/24 (!) 160/90    ASSESSMENT AND PLAN:  Discussed the following assessment and plan:  Diabetes mellitus with coincident hypertension (HCC) - Plan: POC HgB A1c, Basic metabolic panel with GFR  Essential hypertension - Plan: Basic metabolic panel with GFR  Medication management - Plan: Basic metabolic panel with GFR  Hyperlipidemia, unspecified hyperlipidemia type - no change in meds Ht better readings  A1c improving down from 8.7  cont farxiga  and inc dose of lisinopril  Check bmp today . If all ok continue and rov 6 mos  A1c at visit or as indicated  Aware to avoid dehydratiion  -Patient advised to return or notify health care team  if  new concerns arise.  Patient Instructions  Good to see you. A1c is 7.1  Continue  same  meds. for now.              Nema Oatley K. Daxen Lanum M.D.

## 2024-12-15 NOTE — Progress Notes (Signed)
 Potassium  gfr renal function normal range . Continue meds and fu as planned

## 2024-12-15 NOTE — Patient Instructions (Addendum)
 Good to see you. A1c is 7.1  Continue  same  meds. for now.

## 2025-06-21 ENCOUNTER — Ambulatory Visit: Admitting: Internal Medicine
